# Patient Record
Sex: Female | Born: 1980
Health system: Southern US, Community
[De-identification: ages and names within clinical notes are randomized; demographics above are authoritative.]

## PROBLEM LIST (undated history)

## (undated) DIAGNOSIS — K219 Gastro-esophageal reflux disease without esophagitis: Secondary | ICD-10-CM

## (undated) DIAGNOSIS — I890 Lymphedema, not elsewhere classified: Secondary | ICD-10-CM

## (undated) DIAGNOSIS — D649 Anemia, unspecified: Secondary | ICD-10-CM

## (undated) DIAGNOSIS — O24419 Gestational diabetes mellitus in pregnancy, unspecified control: Secondary | ICD-10-CM

## (undated) HISTORY — DX: Lymphedema, not elsewhere classified: I89.0

## (undated) HISTORY — DX: Gastro-esophageal reflux disease without esophagitis: K21.9

## (undated) HISTORY — PX: NO PAST SURGERIES: SHX2092

## (undated) HISTORY — DX: Anemia, unspecified: D64.9

## (undated) HISTORY — DX: Gestational diabetes mellitus in pregnancy, unspecified control: O24.419

---

## 2015-06-02 ENCOUNTER — Other Ambulatory Visit: Payer: Self-pay | Admitting: Hematology and Oncology

## 2015-06-02 DIAGNOSIS — K047 Periapical abscess without sinus: Secondary | ICD-10-CM

## 2015-06-02 MED ORDER — AMOXICILLIN-POT CLAVULANATE 875-125 MG PO TABS: 1.0000 | ORAL_TABLET | Freq: Two times a day (BID) | ORAL | Status: DC

## 2016-06-01 MED FILL — CIPROFLOXACIN 0.3% EYE DROP: 0.3 | 7 days supply | Qty: 5 | Fill #0

## 2017-02-09 MED FILL — IBUPROFEN 600 MG TABLET: 600 | 3 days supply | Qty: 14 | Fill #0

## 2017-04-28 MED FILL — CHLORHEXIDINE 0.12% RINSE: 0.12 | 16 days supply | Qty: 473 | Fill #0

## 2017-04-28 MED FILL — AMOXICILLIN 500 MG CAPSULE: 500 | 7 days supply | Qty: 21 | Fill #0

## 2017-04-28 MED FILL — HYDROCODON-APAP 5-325: 5-325 | 2 days supply | Qty: 16 | Fill #0

## 2018-01-04 DIAGNOSIS — Z01419 Encounter for gynecological examination (general) (routine) without abnormal findings: Secondary | ICD-10-CM | POA: Diagnosis not present

## 2018-01-04 DIAGNOSIS — Z3040 Encounter for surveillance of contraceptives, unspecified: Secondary | ICD-10-CM | POA: Diagnosis not present

## 2018-01-04 DIAGNOSIS — Z124 Encounter for screening for malignant neoplasm of cervix: Secondary | ICD-10-CM | POA: Diagnosis not present

## 2018-01-04 DIAGNOSIS — Z6821 Body mass index (BMI) 21.0-21.9, adult: Secondary | ICD-10-CM | POA: Diagnosis not present

## 2018-01-04 LAB — HM PAP SMEAR

## 2018-01-09 LAB — HM PAP SMEAR

## 2018-03-16 ENCOUNTER — Encounter: Payer: Self-pay | Admitting: Family Medicine

## 2018-03-16 ENCOUNTER — Ambulatory Visit (INDEPENDENT_AMBULATORY_CARE_PROVIDER_SITE_OTHER): Payer: 59 | Admitting: Family Medicine

## 2018-03-16 VITALS — BP 110/74 | HR 55 | Temp 97.8°F | Ht 63.0 in | Wt 124.8 lb

## 2018-03-16 DIAGNOSIS — Z130 Encounter for screening for diseases of the blood and blood-forming organs and certain disorders involving the immune mechanism: Secondary | ICD-10-CM | POA: Diagnosis not present

## 2018-03-16 DIAGNOSIS — Z1231 Encounter for screening mammogram for malignant neoplasm of breast: Secondary | ICD-10-CM | POA: Diagnosis not present

## 2018-03-16 DIAGNOSIS — O24419 Gestational diabetes mellitus in pregnancy, unspecified control: Secondary | ICD-10-CM

## 2018-03-16 DIAGNOSIS — Z13220 Encounter for screening for lipoid disorders: Secondary | ICD-10-CM

## 2018-03-16 DIAGNOSIS — Z1239 Encounter for other screening for malignant neoplasm of breast: Secondary | ICD-10-CM

## 2018-03-16 DIAGNOSIS — Z803 Family history of malignant neoplasm of breast: Secondary | ICD-10-CM

## 2018-03-16 DIAGNOSIS — Z23 Encounter for immunization: Secondary | ICD-10-CM | POA: Diagnosis not present

## 2018-03-16 DIAGNOSIS — Z131 Encounter for screening for diabetes mellitus: Secondary | ICD-10-CM

## 2018-03-16 DIAGNOSIS — Z114 Encounter for screening for human immunodeficiency virus [HIV]: Secondary | ICD-10-CM

## 2018-03-16 DIAGNOSIS — Z0000 Encounter for general adult medical examination without abnormal findings: Secondary | ICD-10-CM | POA: Diagnosis not present

## 2018-03-16 LAB — COMPREHENSIVE METABOLIC PANEL
ALBUMIN: 3.9 g/dL (ref 3.5–5.2)
ALT: 12 U/L (ref 0–35)
AST: 16 U/L (ref 0–37)
Alkaline Phosphatase: 46 U/L (ref 39–117)
BUN: 7 mg/dL (ref 6–23)
CO2: 29 mEq/L (ref 19–32)
CREATININE: 0.54 mg/dL (ref 0.40–1.20)
Calcium: 8.8 mg/dL (ref 8.4–10.5)
Chloride: 102 mEq/L (ref 96–112)
GFR: 135.15 mL/min (ref >60.00)
Glucose, Bld: 74 mg/dL (ref 70–99)
Potassium: 4 mEq/L (ref 3.5–5.1)
SODIUM: 138 meq/L (ref 135–145)
Total Bilirubin: 0.5 mg/dL (ref 0.2–1.2)
Total Protein: 6.8 g/dL (ref 6.0–8.3)

## 2018-03-16 LAB — CBC WITH DIFFERENTIAL/PLATELET
BASOS ABS: 0 10*3/uL (ref 0.0–0.1)
Basophils Relative: 0.7 % (ref 0.0–3.0)
Eosinophils Absolute: 0.1 10*3/uL (ref 0.0–0.7)
Eosinophils Relative: 1.7 % (ref 0.0–5.0)
HCT: 34 % — ABNORMAL LOW (ref 36.0–46.0)
Hemoglobin: 11 g/dL — ABNORMAL LOW (ref 12.0–15.0)
LYMPHS ABS: 2.2 10*3/uL (ref 0.7–4.0)
Lymphocytes Relative: 34.5 % (ref 12.0–46.0)
MCHC: 32.5 g/dL (ref 30.0–36.0)
MCV: 73.3 fl — ABNORMAL LOW (ref 78.0–100.0)
MONO ABS: 0.6 10*3/uL (ref 0.1–1.0)
Monocytes Relative: 9.3 % (ref 3.0–12.0)
NEUTROS PCT: 53.8 % (ref 43.0–77.0)
Neutro Abs: 3.4 10*3/uL (ref 1.4–7.7)
Platelets: 335 10*3/uL (ref 150.0–400.0)
RBC: 4.64 Mil/uL (ref 3.87–5.11)
RDW: 15.1 % (ref 11.5–15.5)
WBC: 6.3 10*3/uL (ref 4.0–10.5)

## 2018-03-16 LAB — LIPID PANEL
CHOLESTEROL: 148 mg/dL (ref 0–200)
HDL: 45.2 mg/dL (ref >39.00)
LDL Cholesterol: 85 mg/dL (ref 0–99)
NonHDL: 102.59
Total CHOL/HDL Ratio: 3
Triglycerides: 88 mg/dL (ref 0.0–149.0)
VLDL: 17.6 mg/dL (ref 0.0–40.0)

## 2018-03-16 LAB — HEMOGLOBIN A1C: Hgb A1c MFr Bld: 5.3 % (ref 4.6–6.5)

## 2018-03-16 NOTE — Progress Notes (Signed)
Patient ID: Jennifer Mcdowell, female  DOB: 1981/05/09, 37 y.o.   MRN: [id number] Patient Care Team    Relationship Specialty Notifications Start End  Natalia Leatherwood, DO PCP - General Family Medicine  03/16/18   Silverio Lay, MD Consulting Physician Obstetrics and Gynecology  03/16/18     Chief Complaint  Patient presents with  . Establish Care    CPE    Subjective:  Jennifer Mcdowell is a 37 y.o.  female present for new patient establishment. All past medical history, surgical history, allergies, family history, immunizations, medications and social history were obtained and entered in the electronic medical record today. All recent labs, ED visits and hospitalizations within the last year were reviewed.  Health maintenance:  Colonoscopy: No Fhx, screen at 50 Mammogram: No prior screens, MGM and MGGM both w/ breast cancer at <60.--> order placed today for early screen, she will check with insurance on coverage. .  Cervical cancer screening: last pap: 01/04/2021, results: HPV +, completed by: Dr. Estanislado Pandy Immunizations: tdap updated today, Influenza declined (encouraged yearly) Infectious disease screening: HIV completed today, pt agreeable.  DEXA: screen at 60 Assistive device: none Oxygen WUJ:WJXB Patient has a Dental home. Hospitalizations/ED visits:reviewed  Depression screen [facility] 2/9 03/16/2018  Decreased Interest 0  Down, Depressed, Hopeless 0  PHQ - 2 Score 0   No flowsheet data found.  Current Exercise Habits: Home exercise routine;Structured exercise class, Type of exercise: treadmill;walking;Other - see comments, Time (Minutes): 30, Frequency (Times/Week): 7, Weekly Exercise (Minutes/Week): 210, Intensity: Moderate   No flowsheet data found.   Immunization History  Administered Date(s) Administered  . Tdap 03/16/2018    No exam data present  Past Medical History:  Diagnosis Date  . Gestational diabetes    Not on File History reviewed. No pertinent  surgical history. Family History  Problem Relation Age of Onset  . Hypertension Father   . Breast cancer Maternal Grandmother 28   Social History   Socioeconomic History  . Marital status: Married    Spouse name: Not on file  . Number of children: Not on file  . Years of education: Not on file  . Highest education level: Not on file  Occupational History  . Not on file  Social Needs  . Financial resource strain: Not on file  . Food insecurity:    Worry: Not on file    Inability: Not on file  . Transportation needs:    Medical: Not on file    Non-medical: Not on file  Tobacco Use  . Smoking status: Never Smoker  . Smokeless tobacco: Never Used  Substance and Sexual Activity  . Alcohol use: Yes    Alcohol/week: 0.6 oz    Types: 1 Glasses of wine per week    Comment: I GLASS OF WINE ONCE IN A WHILE  . Drug use: Yes  . Sexual activity: Yes    Partners: Male    Birth control/protection: Condom  Lifestyle  . Physical activity:    Days per week: Not on file    Minutes per session: Not on file  . Stress: Not on file  Relationships  . Social connections:    Talks on phone: Not on file    Gets together: Not on file    Attends religious service: Not on file    Active member of club or organization: Not on file    Attends meetings of clubs or organizations: Not on file    Relationship status:  Not on file  . Intimate partner violence:    Fear of current or ex partner: Not on file    Emotionally abused: Not on file    Physically abused: Not on file    Forced sexual activity: Not on file  Other Topics Concern  . Not on file  Social History Narrative  . Not on file   Allergies as of 03/16/2018   Not on File     Medication List    as of 03/16/2018 12:15 PM   You have not been prescribed any medications.     All past medical history, surgical history, allergies, family history, immunizations andmedications were updated in the EMR today and reviewed under the history  and medication portions of their EMR.    Recent Results (from the past 2160 hour(s))  HM PAP SMEAR     Status: None   Collection Time: 01/04/18 12:00 AM  Result Value Ref Range   HM Pap smear see report     Patient was never admitted.   ROS: 14 pt review of systems performed and negative (unless mentioned in an HPI)  Objective: BP 110/74 (BP Location: Right Arm, Patient Position: Sitting, Cuff Size: Normal)   Pulse (!) 55   Temp 97.8 F (36.6 C) (Oral)   Ht 5\' 3"  (1.6 m)   Wt 124 lb 12.8 oz (56.6 kg)   LMP 03/12/2018   SpO2 100%   BMI 22.11 kg/m  Gen: Afebrile. No acute distress. Nontoxic in appearance, well-developed, well-nourished,  Very pleasant female.  HENT: AT. [place]. Bilateral TM visualized and normal in appearance, normal external auditory canal. MMM, no oral lesions, adequate dentition. Bilateral nares within normal limits. Throat without erythema, ulcerations or exudates. no Cough on exam, no hoarseness on exam. Eyes:Pupils Equal Round Reactive to light, Extraocular movements intact,  Conjunctiva without redness, discharge or icterus. Neck/lymp/endocrine: Supple,no lymphadenopathy, no thyromegaly CV: RRR no murmur, no edema, +2/4 P posterior tibialis pulses. no carotid bruits. No JVD. Chest: CTAB, no wheeze, rhonchi or crackles. normal Respiratory effort. good Air movement. Abd: Soft. flat. NTND. BS present. no Masses palpated. No hepatosplenomegaly. No rebound tenderness or guarding. Skin: no rashes, purpura or petechiae. Warm and well-perfused. Skin intact. Neuro/Msk:  Normal gait. PERLA. EOMi. Alert. Oriented x3.  Cranial nerves II through XII intact. Muscle strength 5/5 upper/lower extremity. DTRs equal bilaterally. Psych: Normal affect, dress and demeanor. Normal speech. Normal thought content and judgment.   Assessment/plan: Jennifer Mcdowell is a 37 y.o. female present for EST/CPE.  Encounter for preventive health examination - Comprehensive metabolic  panel Patient was encouraged to exercise greater than 150 minutes a week. Patient was encouraged to choose a diet filled with fresh fruits and vegetables, and lean meats. AVS provided to patient today for education/recommendation on gender specific health and safety maintenance. Colonoscopy: No Fhx, screen at 50 Mammogram: No prior screens, MGM and MGGM both w/ breast cancer at <60.--> order placed today for early screen, she will check with insurance on coverage. .  Cervical cancer screening: last pap: 01/04/2021, results: HPV +, completed by: Dr. Estanislado Pandy Immunizations: tdap updated today, Influenza declined (encouraged yearly) Infectious disease screening: HIV completed today, pt agreeable.  DEXA: screen at 60 Diabetes mellitus screening - Hemoglobin A1c Screening for iron deficiency anemia - CBC with Differential/Platelet Screening cholesterol level - Lipid panel H/O Gestational diabetes mellitus (GDM) - Hemoglobin A1c Encounter for screening for HIV - HIV antibody (with reflex) Immunization due - Tdap vaccine greater than or equal  to 7yo IM Breast cancer screening, high risk patient/FHX breast cancer - MM DIGITAL SCREENING BILATERAL; Future    Return in about 1 year (around 03/17/2019) for CPE.   Note is dictated utilizing voice recognition software. Although note has been proof read prior to signing, occasional typographical errors still can be missed. If any questions arise, please do not hesitate to call for verification.  Electronically signed by: Felix Pacinienee Kuneff, DO [facility]

## 2018-03-16 NOTE — Patient Instructions (Signed)

## 2018-03-17 LAB — HIV ANTIBODY (ROUTINE TESTING W REFLEX): HIV 1&2 Ab, 4th Generation: NONREACTIVE

## 2018-03-20 ENCOUNTER — Telehealth: Payer: Self-pay | Admitting: Family Medicine

## 2018-03-20 NOTE — Telephone Encounter (Signed)
Spoke with patient reviewed lab results and instructions. Patient verbalized understanding. Scheduled an appt for patient.

## 2018-03-20 NOTE — Telephone Encounter (Signed)
Please inform patient the following information: Her labs are all normal with the exception of very mild abnormal hgb at 11. Her other levels are in a pattern most consistent with iron deficiency as potential cause. Again, this is just mildly lower than "normal" and I do not have any prior records to compare for her, to know her baseline.  Most common causes if iron deficiency is lack of iron in the diet or blood loss (heavy menses or loss through colon) which she did not report.  I would like to follow up with her and discuss, as well as obtain iron panel. Please have her schedule an appt at her earliest convenience.

## 2018-04-04 ENCOUNTER — Ambulatory Visit (INDEPENDENT_AMBULATORY_CARE_PROVIDER_SITE_OTHER): Payer: 59 | Admitting: Family Medicine

## 2018-04-04 ENCOUNTER — Encounter: Payer: Self-pay | Admitting: Family Medicine

## 2018-04-04 VITALS — BP 118/80 | HR 72 | Temp 98.4°F | Resp 20 | Ht 63.0 in | Wt 122.0 lb

## 2018-04-04 DIAGNOSIS — D649 Anemia, unspecified: Secondary | ICD-10-CM | POA: Diagnosis not present

## 2018-04-04 DIAGNOSIS — R718 Other abnormality of red blood cells: Secondary | ICD-10-CM | POA: Diagnosis not present

## 2018-04-04 LAB — CBC WITH DIFFERENTIAL/PLATELET
BASOS ABS: 0.1 10*3/uL (ref 0.0–0.1)
Basophils Relative: 1.1 % (ref 0.0–3.0)
Eosinophils Absolute: 0.2 10*3/uL (ref 0.0–0.7)
Eosinophils Relative: 2.8 % (ref 0.0–5.0)
HCT: 37.4 % (ref 36.0–46.0)
Hemoglobin: 12.2 g/dL (ref 12.0–15.0)
Lymphocytes Relative: 36.5 % (ref 12.0–46.0)
Lymphs Abs: 2.5 10*3/uL (ref 0.7–4.0)
MCHC: 32.7 g/dL (ref 30.0–36.0)
MCV: 73 fl — AB (ref 78.0–100.0)
MONO ABS: 0.8 10*3/uL (ref 0.1–1.0)
Monocytes Relative: 11.2 % (ref 3.0–12.0)
NEUTROS ABS: 3.4 10*3/uL (ref 1.4–7.7)
NEUTROS PCT: 48.4 % (ref 43.0–77.0)
PLATELETS: 272 10*3/uL (ref 150.0–400.0)
RBC: 5.12 Mil/uL — AB (ref 3.87–5.11)
RDW: 14.9 % (ref 11.5–15.5)
WBC: 7 10*3/uL (ref 4.0–10.5)

## 2018-04-04 NOTE — Patient Instructions (Signed)
I will call you with labs results once available.    Iron Deficiency Anemia, Adult Iron-deficiency anemia is when you have a low amount of red blood cells or hemoglobin. This happens because you have too little iron in your body. Hemoglobin carries oxygen to parts of the body. Anemia can cause your body to not get enough oxygen. It may or may not cause symptoms. Follow these instructions at home: Medicines  Take over-the-counter and prescription medicines only as told by your doctor. This includes iron pills (supplements) and vitamins.  If you cannot handle taking iron pills by mouth, ask your doctor about getting iron through: ? A vein (intravenously). ? A shot (injection) into a muscle.  Take iron pills when your stomach is empty. If you cannot handle this, take them with food.  Do not drink milk or take antacids at the same time as your iron pills.  To prevent trouble pooping (constipation), eat fiber or take medicine (stool softener) as told by your doctor. Eating and drinking  Talk with your doctor before changing the foods you eat. He or she may tell you to eat foods that have a lot of iron, such as: ? Liver. ? Lowfat (lean) beef. ? Breads and cereals that have iron added to them (fortified breads and cereals). ? Eggs. ? Dried fruit. ? Dark green, leafy vegetables.  Drink enough fluid to keep your pee (urine) clear or pale yellow.  Eat fresh fruits and vegetables that are high in vitamin C. They help your body to use iron. Foods with a lot of vitamin C include: ? Oranges. ? Peppers. ? Tomatoes. ? Mangoes. General instructions  Return to your normal activities as told by your doctor. Ask your doctor what activities are safe for you.  Keep yourself clean, and keep things clean around you (your surroundings). Anemia can make you get sick more easily.  Keep all follow-up visits as told by your doctor. This is important. Contact a doctor if:  You feel sick to your  stomach (nauseous).  You throw up (vomit).  You feel weak.  You are sweating for no clear reason.  You have trouble pooping, such as: ? Pooping (having a bowel movement) less than 3 times a week. ? Straining to poop. ? Having poop that is hard, dry, or larger than normal. ? Feeling full or bloated. ? Pain in the lower belly. ? Not feeling better after pooping. Get help right away if:  You pass out (faint). If this happens, do not drive yourself to the hospital. Call your local emergency services (911 in the [place]  You have chest pain.  You have shortness of breath that: ? Is very bad. ? Gets worse with physical activity.  You have a fast heartbeat.  You get light-headed when getting up from sitting or lying down. This information is not intended to replace advice given to you by your health care provider. Make sure you discuss any questions you have with your health care provider. Document Released: 12/24/2010 Document Revised: 08/10/2016 Document Reviewed: 08/10/2016 Elsevier Interactive Patient Education  2017 [employer].

## 2018-04-04 NOTE — Progress Notes (Signed)
Jennifer Mcdowell , [birth year], 37 y.o., female MRN: [id number] Patient Care Team    Relationship Specialty Notifications Start End  Jennifer Leatherwood, DO PCP - General Family Medicine  03/16/18   Jennifer Lay, MD Consulting Physician Obstetrics and Gynecology  03/16/18     Chief Complaint  Patient presents with  . abnormal lab results     Subjective:  Presents today to discuss her abnormal labs.  Briefly patient was found to have mildly decreased hemoglobin, hematocrit and MCV.  Patient does not eat red meat.  She denies any heavy menses.  Reports her menstrual cycle occurs once monthly, last about 1 week, and is only heavy 1 day.  She states she was on her menstrual cycle when labs were collected last visit.  She denies any rectal bleeding.  She denies any chest pain, shortness of breath or fatigue.  She admits to mild weakness in her lower extremities on occasions.  Please inform patient the following information: Her labs are all normal with the exception of very mild abnormal hgb at 11. Her other levels are in a pattern most consistent with iron deficiency as potential cause. Again, this is just mildly lower than "normal" and I do not have any prior records to compare for her, to know her baseline.  Most common causes if iron deficiency is lack of iron in the diet or blood loss (heavy menses or loss through colon) which she did not report.  I would like to follow up with her and discuss, as well as obtain iron panel. Please have her schedule an appt at her earliest convenience   Depression screen [facility] 2/9 03/16/2018  Decreased Interest 0  Down, Depressed, Hopeless 0  PHQ - 2 Score 0    No Known Allergies Social History   Tobacco Use  . Smoking status: Never Smoker  . Smokeless tobacco: Never Used  Substance Use Topics  . Alcohol use: Yes    Alcohol/week: 0.6 oz    Types: 1 Glasses of wine per week    Comment: I GLASS OF WINE ONCE IN A WHILE   Past Medical History:    Diagnosis Date  . Gestational diabetes    History reviewed. No pertinent surgical history. Family History  Problem Relation Age of Onset  . Hypertension Father   . Breast cancer Maternal Grandmother 60   Allergies as of 04/04/2018   No Known Allergies     Medication List    as of 04/04/2018  8:45 AM   You have not been prescribed any medications.     All past medical history, surgical history, allergies, family history, immunizations andmedications were updated in the EMR today and reviewed under the history and medication portions of their EMR.     ROS: Negative, with the exception of above mentioned in HPI   Objective:  BP 118/80 (BP Location: Left Arm, Patient Position: Sitting, Cuff Size: Normal)   Pulse 72   Temp 98.4 F (36.9 C)   Resp 20   Ht  (1.6 m)   Wt 122 lb (55.3 kg)   LMP 03/12/2018   SpO2 100%   BMI 21.61 kg/m  Body mass index is 21.61 kg/m. Gen: Afebrile. No acute distress. Nontoxic in appearance, well developed, well nourished.  HENT: AT. [place].  MMM Eyes:Pupils Equal Round Reactive to light, Extraocular movements intact,  Conjunctiva without redness, discharge or icterus. Neck/lymp/endocrine: Supple, no lymphadenopathy CV: RRR no murmur, no edema Chest: CTAB, no wheeze or  crackles. Good air movement, normal resp effort.  Abd: Soft.NTND. BS+ Neuro:  Normal gait. PERLA. EOMi. Alert. Oriented x3   No exam data present No results found. No results found for this or any previous visit (from the past 24 hour(s)).  Assessment/Plan: Jennifer Mcdowell is a 36 y.o. female present for OV for  Anemia, unspecified type Low mean corpuscular volume (MCV) Suspect dietary cause likely iron insufficiency.  Repeat CBC today with added iron panel. -If iron deficient patient will be encouraged to consume higher iron rather through dietary means or supplements.  If supplements are used to she should use stool softener to avoid constipation. - CBC w/Diff -  Iron, TIBC and Ferritin Panel -Follow-up PRN  Reviewed expectations re: course of current medical issues.  Discussed self-management of symptoms.  Outlined signs and symptoms indicating need for more acute intervention.  Patient verbalized understanding and all questions were answered.  Patient received an After-Visit Summary.    No orders of the defined types were placed in this encounter.    Note is dictated utilizing voice recognition software. Although note has been proof read prior to signing, occasional typographical errors still can be missed. If any questions arise, please do not hesitate to call for verification.   electronically signed by:  Jennifer Pacini, DO  [facility] [place]

## 2018-04-05 LAB — IRON,TIBC AND FERRITIN PANEL
%SAT: 8 % — AB (ref 11–50)
Ferritin: 6 ng/mL — ABNORMAL LOW (ref 10–154)
Iron: 34 ug/dL — ABNORMAL LOW (ref 40–190)
TIBC: 434 ug/dL (ref 250–450)

## 2018-05-29 ENCOUNTER — Telehealth: Payer: Self-pay

## 2018-05-29 NOTE — Telephone Encounter (Signed)
Patient notified she needs to schedule an appointment. Appointment scheduled.

## 2018-05-29 NOTE — Telephone Encounter (Signed)
Copied from CRM [phone]. Topic: Inquiry >> May 29, 2018  9:22 AM Windy KalataMichael, Taylor L, NT wrote: Reason for CRM: patient is calling and states she has noticed a increase in acne for 3 months. She states it is very painful. Requesting to speak with the doctor about this.

## 2018-05-30 ENCOUNTER — Ambulatory Visit
Admission: RE | Admit: 2018-05-30 | Discharge: 2018-05-30 | Disposition: A | Discharge status: Home / Self Care | Payer: 59 | Source: Ambulatory Visit | Attending: Family Medicine | Admitting: Family Medicine

## 2018-05-30 ENCOUNTER — Encounter: Payer: Self-pay | Admitting: Family Medicine

## 2018-05-30 DIAGNOSIS — Z1231 Encounter for screening mammogram for malignant neoplasm of breast: Secondary | ICD-10-CM | POA: Diagnosis not present

## 2018-05-30 DIAGNOSIS — Z1239 Encounter for other screening for malignant neoplasm of breast: Secondary | ICD-10-CM

## 2018-05-30 DIAGNOSIS — R928 Other abnormal and inconclusive findings on diagnostic imaging of breast: Secondary | ICD-10-CM | POA: Insufficient documentation

## 2018-05-30 DIAGNOSIS — Z803 Family history of malignant neoplasm of breast: Secondary | ICD-10-CM

## 2018-05-31 ENCOUNTER — Encounter: Payer: Self-pay | Admitting: Family Medicine

## 2018-05-31 ENCOUNTER — Other Ambulatory Visit: Payer: Self-pay | Admitting: Family Medicine

## 2018-05-31 ENCOUNTER — Ambulatory Visit (INDEPENDENT_AMBULATORY_CARE_PROVIDER_SITE_OTHER): Payer: 59 | Admitting: Family Medicine

## 2018-05-31 ENCOUNTER — Telehealth: Payer: Self-pay | Admitting: *Deleted

## 2018-05-31 VITALS — BP 103/72 | HR 63 | Temp 98.3°F | Resp 20 | Ht 63.0 in | Wt 124.0 lb

## 2018-05-31 DIAGNOSIS — L700 Acne vulgaris: Secondary | ICD-10-CM | POA: Diagnosis not present

## 2018-05-31 DIAGNOSIS — R928 Other abnormal and inconclusive findings on diagnostic imaging of breast: Secondary | ICD-10-CM

## 2018-05-31 MED ORDER — CLINDAMYCIN PHOS-BENZOYL PEROX 1-5 % EX GEL: Freq: Every day | CUTANEOUS | 5 refills | Status: DC

## 2018-05-31 MED ORDER — DOXYCYCLINE HYCLATE 100 MG PO TABS: 100.0000 mg | ORAL_TABLET | Freq: Every day | ORAL | 0 refills | Status: DC

## 2018-05-31 MED FILL — CLINDAMYCIN PHOS-BENZOYL PE: 1-5 | 20 days supply | Qty: 50 | Fill #0

## 2018-05-31 MED FILL — DOXYCYCLINE HYCLATE 100 MG: 100 | 90 days supply | Qty: 90 | Fill #0

## 2018-05-31 NOTE — Patient Instructions (Signed)
Cleanse twice a day with dove scent free soap.  Start gel at night.  Start doxycyline once daily with food.  Follow up 3 months, sooner if needed .    Acne Acne is a skin problem that causes small, red bumps (pimples). Acne happens when the tiny holes in your skin (pores) get blocked. Your pores may become red, sore, and swollen. They may also become infected. Acne is a common skin problem. It is especially common in teenagers. Acne usually goes away over time. Follow these instructions at home: Good skin care is the most important thing you can do to treat your acne. Take care of your skin as told by your doctor. You may be told to do these things:  Wash your skin gently at least two times each day. You should also wash your skin: ? After you exercise. ? Before you go to bed.  Use mild soap.  Use a water-based skin moisturizer after you wash your skin.  Use a sunscreen or sunblock with SPF 30 or greater. This is very important if you are using acne medicines.  Choose cosmetics that will not plug your oil glands (are noncomedogenic).  Medicines  Take over-the-counter and prescription medicines only as told by your doctor.  If you were prescribed an antibiotic medicine, apply or take it as told by your doctor. Do not stop using the antibiotic even if your acne improves. General instructions  Keep your hair clean and off of your face. Shampoo your hair regularly. If you have oily hair, you may need to wash it every day.  Avoid leaning your chin or forehead on your hands.  Avoid wearing tight headbands or hats.  Avoid picking or squeezing your pimples. That can make your acne worse and cause scarring.  Keep all follow-up visits as told by your doctor. This is important.  Shave gently. Only shave when it is necessary.  Keep a food journal. This can help you to see if any foods are linked with your acne. Contact a doctor if:  Your acne is not better after eight weeks.  Your  acne gets worse.  You have a large area of skin that is red or tender.  You think that you are having side effects from any acne medicine. This information is not intended to replace advice given to you by your health care provider. Make sure you discuss any questions you have with your health care provider. Document Released: 11/10/2011 Document Revised: 04/28/2016 Document Reviewed: 01/28/2015 Elsevier Interactive Patient Education  [employer]2018 Else[employer] Inc[employer]

## 2018-05-31 NOTE — Telephone Encounter (Signed)
[facility] received requesting additional testing. Placed information on Dr Alan RipperKuneff's desk.

## 2018-05-31 NOTE — Telephone Encounter (Signed)
I already signed in the computer today. Use stamp if they are now sending another form to sign.

## 2018-05-31 NOTE — Progress Notes (Signed)
Jennifer Mcdowell , [birth year], 37 y.o., female MRN: [id number] Patient Care Team    Relationship Specialty Notifications Start End  Natalia LeatherwoodKuneff, Renee A, DO PCP - General Family Medicine  03/16/18   Silverio Layivard, Sandra, MD Consulting Physician Obstetrics and Gynecology  03/16/18     Chief Complaint  Patient presents with  . Acne     Subjective: Pt presents for an OV with complaints of increase in acne of 4 months  duration.  Associated symptoms include painful cystic lesions  jawline and chin. She reports she had rather bad acne as a child. Her menstrual cycles are heavier in nature. She has had anemia. She has been trying to wash her face twice a day and using "clean and clear" product.   Depression screen PH[facility] 2/9 03/16/2018  Decreased Interest 0  Down, Depressed, Hopeless 0  PHQ - 2 Score 0    No Known Allergies Social History   Tobacco Use  . Smoking status: Never Smoker  . Smokeless tobacco: Never Used  Substance Use Topics  . Alcohol use: Yes    Alcohol/week: 0.6 oz    Types: 1 Glasses of wine per week    Comment: I GLASS OF WINE ONCE IN A WHILE   Past Medical History:  Diagnosis Date  . Gestational diabetes    History reviewed. No pertinent surgical history. Family History  Problem Relation Age of Onset  . Hypertension Father   . Breast cancer Maternal Grandmother 55       passed away at 55 in stage 4 at diagnosis.    Allergies as of 05/31/2018   No Known Allergies     Medication List    as of 05/31/2018 10:15 AM   You have not been prescribed any medications.     All past medical history, surgical history, allergies, family history, immunizations andmedications were updated in the EMR today and reviewed under the history and medication portions of their EMR.     ROS: Negative, with the exception of above mentioned in HPI   Objective:  BP 103/72 (BP Location: Left Arm, Patient Position: Sitting, Cuff Size: Normal)   Pulse 63   Temp 98.3 F (36.8 C)    Resp 20   Ht 5\' 3"  (1.6 m)   Wt 124 lb (56.2 kg)   LMP 05/15/2018   SpO2 99%   BMI 21.97 kg/m  Body mass index is 21.97 kg/m. Gen: Afebrile. No acute distress. Nontoxic in appearance, well developed, well nourished.  HENT: AT. [place].  MMM Eyes:Pupils Equal Round Reactive to light, Extraocular movements intact,  Conjunctiva without redness, discharge or icterus. Skin: x5 cystic tender acne lesions left jawline, x2 cystic lesions chin. > 20 closed comedone lesions chin and cheeks.  Neuro:  Normal gait. PERLA. EOMi. Alert. Oriented x3   No results found for this or any previous visit (from the past 24 hour(s)).  Assessment/Plan: Jennifer Mcdowell is a 37 y.o. female present for OV for  Cystic acne vulgaris Discussed multiple options with her today.  - OCP is an option, If not responsive to daily acne tx.  - She decided on benzaclin gel nightly and daily doxy 100 mg. - consider adapalene in place of benzaclin if not effective. - Wash face BID with dove, unscented soap.  - follow up in 3 months sooner if worsening.     Reviewed expectations re: course of current medical issues.  Discussed self-management of symptoms.  Outlined signs and symptoms indicating need for more  acute intervention.  Patient verbalized understanding and all questions were answered.  Patient received an After-Visit Summary.    No orders of the defined types were placed in this encounter.    Note is dictated utilizing voice recognition software. Although note has been proof read prior to signing, occasional typographical errors still can be missed. If any questions arise, please do not hesitate to call for verification.   electronically signed by:  Howard Pouch, DO  [place]

## 2018-05-31 NOTE — Telephone Encounter (Signed)
Form [facility] to [facility]

## 2018-06-05 ENCOUNTER — Ambulatory Visit
Admission: RE | Admit: 2018-06-05 | Discharge: 2018-06-05 | Disposition: A | Discharge status: Home / Self Care | Payer: 59 | Source: Ambulatory Visit | Attending: Family Medicine | Admitting: Family Medicine

## 2018-06-05 ENCOUNTER — Other Ambulatory Visit: Payer: Self-pay | Admitting: Family Medicine

## 2018-06-05 DIAGNOSIS — N6310 Unspecified lump in the right breast, unspecified quadrant: Secondary | ICD-10-CM

## 2018-06-05 DIAGNOSIS — R928 Other abnormal and inconclusive findings on diagnostic imaging of breast: Secondary | ICD-10-CM

## 2018-06-05 DIAGNOSIS — N6312 Unspecified lump in the right breast, upper inner quadrant: Secondary | ICD-10-CM | POA: Diagnosis not present

## 2018-06-05 DIAGNOSIS — N6311 Unspecified lump in the right breast, upper outer quadrant: Secondary | ICD-10-CM | POA: Diagnosis not present

## 2018-06-05 DIAGNOSIS — R922 Inconclusive mammogram: Secondary | ICD-10-CM | POA: Diagnosis not present

## 2018-06-11 ENCOUNTER — Encounter: Payer: Self-pay | Admitting: Family Medicine

## 2018-06-11 DIAGNOSIS — N6310 Unspecified lump in the right breast, unspecified quadrant: Secondary | ICD-10-CM | POA: Insufficient documentation

## 2018-09-10 ENCOUNTER — Ambulatory Visit (INDEPENDENT_AMBULATORY_CARE_PROVIDER_SITE_OTHER): Payer: 59

## 2018-09-10 DIAGNOSIS — Z23 Encounter for immunization: Secondary | ICD-10-CM | POA: Diagnosis not present

## 2018-09-12 ENCOUNTER — Encounter: Payer: Self-pay | Admitting: Family Medicine

## 2018-09-12 ENCOUNTER — Ambulatory Visit: Payer: 59 | Admitting: Family Medicine

## 2018-09-12 VITALS — BP 129/88 | HR 80 | Temp 98.6°F | Resp 20 | Ht 63.0 in | Wt 123.0 lb

## 2018-09-12 DIAGNOSIS — D509 Iron deficiency anemia, unspecified: Secondary | ICD-10-CM | POA: Diagnosis not present

## 2018-09-12 DIAGNOSIS — L700 Acne vulgaris: Secondary | ICD-10-CM

## 2018-09-12 LAB — CBC
HEMATOCRIT: 38.9 % (ref 36.0–46.0)
Hemoglobin: 12.7 g/dL (ref 12.0–15.0)
MCHC: 32.7 g/dL (ref 30.0–36.0)
MCV: 81.5 fl (ref 78.0–100.0)
PLATELETS: 316 10*3/uL (ref 150.0–400.0)
RBC: 4.77 Mil/uL (ref 3.87–5.11)
RDW: 13.6 % (ref 11.5–15.5)
WBC: 6.6 10*3/uL (ref 4.0–10.5)

## 2018-09-12 MED ORDER — DOXYCYCLINE HYCLATE 100 MG PO TABS: 100.0000 mg | ORAL_TABLET | Freq: Every day | ORAL | 3 refills | Status: DC

## 2018-09-12 MED FILL — DOXYCYCLINE HYCLATE 100 MG: 100 | 90 days supply | Qty: 90 | Fill #0

## 2018-09-12 NOTE — Patient Instructions (Addendum)
Refilled doxycycline for you today. Continue to wash daily and apply the gel at night (many refills on gel). As long as doing well, will see you yearly for this (can be your physical).  Continue iron, we will call with results and adjust dose or consider GI or GYN referral.  Continue to try to add iron rich foods to diet, along with multivitamin. Adding Vit C can help with iron absorption.   Iron-Rich Diet Iron is a mineral that helps your body to produce hemoglobin. Hemoglobin is a protein in your red blood cells that carries oxygen to your body's tissues. Eating too little iron may cause you to feel weak and tired, and it can increase your risk for infection. Eating enough iron is necessary for your body's metabolism, muscle function, and nervous system. Iron is naturally found in many foods. It can also be added to foods or fortified in foods. There are two types of dietary iron:  Heme iron. Heme iron is absorbed by the body more easily than nonheme iron. Heme iron is found in meat, poultry, and fish.  Nonheme iron. Nonheme iron is found in dietary supplements, iron-fortified grains, beans, and vegetables.  You may need to follow an iron-rich diet if:  You have been diagnosed with iron deficiency or iron-deficiency anemia.  You have a condition that prevents you from absorbing dietary iron, such as: ? Infection in your intestines. ? Celiac disease. This involves long-lasting (chronic) inflammation of your intestines.  You do not eat enough iron.  You eat a diet that is high in foods that impair iron absorption.  You have lost a lot of blood.  You have heavy bleeding during your menstrual cycle.  You are pregnant.  What is my plan? Your health care provider may help you to determine how much iron you need per day based on your condition. Generally, when a person consumes sufficient amounts of iron in the diet, the following iron needs are met:  Men. ? 68-31 years old: 11 mg  per day. ? 25-45 years old: 8 mg per day.  Women. ? 39-27 years old: 15 mg per day. ? 48-15 years old: 18 mg per day. ? Over 24 years old: 8 mg per day. ? Pregnant women: 27 mg per day. ? Breastfeeding women: 9 mg per day.  What do I need to know about an iron-rich diet?  Eat fresh fruits and vegetables that are high in vitamin C along with foods that are high in iron. This will help increase the amount of iron that your body absorbs from food, especially with foods containing nonheme iron. Foods that are high in vitamin C include oranges, peppers, tomatoes, and mango.  Take iron supplements only as directed by your health care provider. Overdose of iron can be life-threatening. If you were prescribed iron supplements, take them with orange juice or a vitamin C supplement.  Cook foods in pots and pans that are made from iron.  Eat nonheme iron-containing foods alongside foods that are high in heme iron. This helps to improve your iron absorption.  Certain foods and drinks contain compounds that impair iron absorption. Avoid eating these foods in the same meal as iron-rich foods or with iron supplements. These include: ? Coffee, black tea, and red wine. ? Milk, dairy products, and foods that are high in calcium. ? Beans, soybeans, and peas. ? Whole grains.  When eating foods that contain both nonheme iron and compounds that impair iron absorption, follow these tips  to absorb iron better. ? Soak beans overnight before cooking. ? Soak whole grains overnight and drain them before using. ? Ferment flours before baking, such as using yeast in bread dough. What foods can I eat? Grains Iron-fortified breakfast cereal. Iron-fortified whole-wheat bread. Enriched rice. Sprouted grains. Vegetables Spinach. Potatoes with skin. Green peas. Broccoli. Red and green bell peppers. Fermented vegetables. Fruits Prunes. Raisins. Oranges. Strawberries. Mango. Grapefruit. Meats and Other Protein  Sources Beef liver. Oysters. Beef. Shrimp. [place]. Chicken. [place]. Sardines. Chickpeas. Nuts. Tofu. Beverages Tomato juice. Fresh orange juice. Prune juice. Hibiscus tea. Fortified instant breakfast shakes. Condiments Tahini. Fermented soy sauce. Sweets and Desserts Black-strap molasses. Other Wheat germ. The items listed above may not be a complete list of recommended foods or beverages. Contact your dietitian for more options. What foods are not recommended? Grains Whole grains. Bran cereal. Bran flour. Oats. Vegetables Artichokes. Brussels sprouts. Kale. Fruits Blueberries. Raspberries. Strawberries. Figs. Meats and Other Protein Sources Soybeans. Products made from soy protein. Dairy Milk. Cream. Cheese. Yogurt. Cottage cheese. Beverages Coffee. Black tea. Red wine. Sweets and Desserts Cocoa. Chocolate. Ice cream. Other Basil. Oregano. Parsley. The items listed above may not be a complete list of foods and beverages to avoid. Contact your dietitian for more information. This information is not intended to replace advice given to you by your health care provider. Make sure you discuss any questions you have with your health care provider. Document Released: 07/05/2005 Document Revised: 06/10/2016 Document Reviewed: 06/18/2014 Elsevier Interactive Patient Education  [employer].

## 2018-09-12 NOTE — Progress Notes (Signed)
Jennifer Mcdowell , Aug 03, 1981, 37 y.o., female MRN: [id number] Patient Care Team    Relationship Specialty Notifications Start End  Natalia Leatherwood, DO PCP - General Family Medicine  03/16/18   Silverio Lay, MD Consulting Physician Obstetrics and Gynecology  03/16/18     Chief Complaint  Patient presents with  . Follow-up    anemia     Subjective: Jennifer Mcdowell is a 37 y.o. female present for Iron deficiency anemia, unspecified iron deficiency anemia type Pt reports she is taking 1 tab of iron daily. She is unable to tolerate more frequent dosing 2/2 to GI side effects. She is taking with food and using colace. She denies chest pain, SOB or fatigue.  Prior note:  Presents today to discuss her abnormal labs.  Briefly patient was found to have mildly decreased hemoglobin, hematocrit and MCV.  Patient does not eat red meat.  She denies any heavy menses.  Reports her menstrual cycle occurs once monthly, last about 1 week, and is only heavy 1 day.  She states she was on her menstrual cycle when labs were collected last visit.  She denies any rectal bleeding.  She denies any chest pain, shortness of breath or fatigue.  She admits to mild weakness in her lower extremities on occasions.  Cystic Acne: She reports her acne is much improved on benzacilin gel and Doxy 100 mg QD. She has occasional pimple formation, but overall more comfortable, less painful and no more cystic area.  Prior note: Pt presents for an OV with complaints of increase in acne of 4 months  duration.  Associated symptoms include painful cystic lesions  jawline and chin. She reports she had rather bad acne as a child. Her menstrual cycles are heavier in nature. She has had anemia. She has been trying to wash her face twice a day and using "clean and clear" product.   Depression screen PHQ 2/9 03/16/2018  Decreased Interest 0  Down, Depressed, Hopeless 0  PHQ - 2 Score 0    No Known Allergies Social History    Tobacco Use  . Smoking status: Never Smoker  . Smokeless tobacco: Never Used  Substance Use Topics  . Alcohol use: Yes    Alcohol/week: 1.0 standard drinks    Types: 1 Glasses of wine per week    Comment: I GLASS OF WINE ONCE IN A WHILE   Past Medical History:  Diagnosis Date  . Gestational diabetes    History reviewed. No pertinent surgical history. Family History  Problem Relation Age of Onset  . Hypertension Father   . Breast cancer Maternal Grandmother 55       passed away at 55 in stage 4 at diagnosis.    Allergies as of 09/12/2018   No Known Allergies     Medication List        Accurate as of 09/12/18  8:33 AM. Always use your most recent med list.          clindamycin-benzoyl peroxide gel Commonly known as:  BENZACLIN Apply topically at bedtime.   doxycycline 100 MG tablet Commonly known as:  VIBRA-TABS Take 1 tablet (100 mg total) by mouth daily.       All past medical history, surgical history, allergies, family history, immunizations andmedications were updated in the EMR today and reviewed under the history and medication portions of their EMR.     ROS: Negative, with the exception of above mentioned in HPI   Objective:  BP  129/88 (BP Location: Left Arm, Patient Position: Sitting, Cuff Size: Normal)   Pulse 80   Temp 98.6 F (37 C)   Resp 20   Ht 5\' 3"  (1.6 m)   Wt 123 lb (55.8 kg)   SpO2 99%   BMI 21.79 kg/m  Body mass index is 21.79 kg/m. Gen: Afebrile. No acute distress. Nontoxic.  Eyes:Pupils Equal Round Reactive to light, Extraocular movements intact,  Conjunctiva without redness, discharge or icterus. CV: RRR no murmur, no edema, +2/4 P posterior tibialis pulses Chest: CTAB, no wheeze or crackles Skin: no rashes, purpura or petechiae. X3 closed comedones chin area and x1 large white head nasal bridge.    No results found for this or any previous visit (from the past 24 hour(s)).  Assessment/Plan: Jennifer Mcdowell is a 37 y.o.  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 Pt is please with outcome.  - OCP is an option, If not responsive to daily acne tx in the future - Continue  benzaclin gel nightly and daily doxy 100 mg.- refilled today. She is try 1/2 tab doxy if able once well controlled and can increase to 100 mg during flares.  - Wash face BID with dove, unscented soap.  - follow up yearly  Anemia, unspecified type Low mean corpuscular volume (MCV) Suspect dietary cause likely iron insufficiency.  Only taking iron Qd secondary to GI tolerance. Continue dose for now with stool softener. Continue to incorporate iron rich foods in diet. Add vit C supplement.  - If iron is not adequate may need to consider other options.  - CBC w/Diff - Iron, TIBC and Ferritin Panel -Follow-up dependent on labs.    Reviewed expectations re: course of current medical issues.  Discussed self-management of symptoms.  Outlined signs and symptoms indicating need for more acute intervention.  Patient verbalized understanding and all questions were answered.  Patient received an After-Visit Summary.    No orders of the defined types were placed in this encounter.  > 15 minutes spent with patient, >50% of time spent face to face counseling   Note is dictated utilizing voice recognition software. Although note has been proof read prior to signing, occasional typographical errors still can be missed. If any questions arise, please do not hesitate to call for verification.   electronically signed by:  Felix Pacini, DO  [facility] - [place]

## 2018-09-13 ENCOUNTER — Other Ambulatory Visit: Payer: Self-pay | Admitting: Family Medicine

## 2018-09-13 LAB — IRON,TIBC AND FERRITIN PANEL
%SAT: 16 % (calc) (ref 16–45)
Ferritin: 43 ng/mL (ref 16–154)
Iron: 47 ug/dL (ref 40–190)
TIBC: 299 ug/dL (ref 250–450)

## 2018-09-13 MED ORDER — POLYSACCHARIDE IRON COMPLEX 150 MG PO CAPS: 150.0000 mg | ORAL_CAPSULE | ORAL | 3 refills | Status: DC

## 2018-09-13 MED FILL — FERREX 150 CAPSULE: 150 | 90 days supply | Qty: 45 | Fill #0

## 2018-11-15 MED FILL — CLINDAMYCIN PHOS-BENZOYL PE: 1-5 | 20 days supply | Qty: 50 | Fill #1

## 2018-12-07 ENCOUNTER — Other Ambulatory Visit: Payer: 59

## 2018-12-07 ENCOUNTER — Other Ambulatory Visit: Payer: Self-pay | Admitting: Family Medicine

## 2018-12-07 ENCOUNTER — Ambulatory Visit
Admission: RE | Admit: 2018-12-07 | Discharge: 2018-12-07 | Disposition: A | Discharge status: Home / Self Care | Payer: 59 | Source: Ambulatory Visit | Attending: Family Medicine | Admitting: Family Medicine

## 2018-12-07 DIAGNOSIS — N6311 Unspecified lump in the right breast, upper outer quadrant: Secondary | ICD-10-CM | POA: Diagnosis not present

## 2018-12-07 DIAGNOSIS — N6310 Unspecified lump in the right breast, unspecified quadrant: Secondary | ICD-10-CM

## 2018-12-07 DIAGNOSIS — N6312 Unspecified lump in the right breast, upper inner quadrant: Secondary | ICD-10-CM | POA: Diagnosis not present

## 2018-12-07 DIAGNOSIS — R922 Inconclusive mammogram: Secondary | ICD-10-CM | POA: Diagnosis not present

## 2018-12-07 MED FILL — DOXYCYCLINE HYCLATE 100 MG: 100 | 90 days supply | Qty: 90 | Fill #1

## 2018-12-07 MED FILL — FERREX 150 CAPSULE: 150 | 90 days supply | Qty: 45 | Fill #1

## 2019-02-08 DIAGNOSIS — Z6821 Body mass index (BMI) 21.0-21.9, adult: Secondary | ICD-10-CM | POA: Diagnosis not present

## 2019-02-08 DIAGNOSIS — Z01419 Encounter for gynecological examination (general) (routine) without abnormal findings: Secondary | ICD-10-CM | POA: Diagnosis not present

## 2019-02-08 DIAGNOSIS — Z3040 Encounter for surveillance of contraceptives, unspecified: Secondary | ICD-10-CM | POA: Diagnosis not present

## 2019-02-15 ENCOUNTER — Other Ambulatory Visit: Payer: Self-pay

## 2019-02-15 ENCOUNTER — Ambulatory Visit: Payer: 59 | Admitting: Family Medicine

## 2019-02-15 ENCOUNTER — Encounter: Payer: Self-pay | Admitting: Family Medicine

## 2019-02-15 VITALS — BP 120/62 | HR 64 | Temp 98.5°F | Wt 123.0 lb

## 2019-02-15 DIAGNOSIS — J029 Acute pharyngitis, unspecified: Secondary | ICD-10-CM | POA: Diagnosis not present

## 2019-02-15 DIAGNOSIS — R6889 Other general symptoms and signs: Secondary | ICD-10-CM

## 2019-02-15 LAB — POCT RAPID STREP A (OFFICE): Rapid Strep A Screen: NEGATIVE

## 2019-02-15 LAB — POC INFLUENZA A&B (BINAX/QUICKVUE)
Influenza A, POC: NEGATIVE
Influenza B, POC: NEGATIVE

## 2019-02-15 NOTE — Progress Notes (Signed)
Jennifer Mcdowell DOB: [birth year] Encounter date: 02/15/2019  This is a 38 y.o. female who presents with Chief Complaint  Patient presents with  . Sore Throat    x 4 days, body aches, dry cough    History of present illness:  Started with sore throat - constricting sensation. Body aches last night. No fevers. Just wanted to make sure no flu because she has a couple of kids at home.   Yesterday throat was much worse than today.   Now more dry cough. Coughs when she talks.   No known sick exposures.     No Known Allergies Current Meds  Medication Sig  . clindamycin-benzoyl peroxide (BENZACLIN) gel Apply topically at bedtime.  Marland Kitchen doxycycline (VIBRA-TABS) 100 MG tablet Take 1 tablet (100 mg total) by mouth daily.  . ferrous sulfate 325 (65 FE) MG EC tablet Take 325 mg by mouth daily.  . iron polysaccharides (NIFEREX) 150 MG capsule Take 1 capsule (150 mg total) by mouth every other day.    Review of Systems  Constitutional: Negative for chills, fatigue and fever.  HENT: Positive for sore throat. Negative for congestion.   Respiratory: Positive for cough. Negative for chest tightness, shortness of breath and wheezing.   Cardiovascular: Negative for chest pain.  Gastrointestinal: Negative for abdominal pain (minimal period cramping today.    Objective:  BP 120/62 (BP Location: Left Arm, Patient Position: Sitting, Cuff Size: Normal)   Pulse 64   Temp 98.5 F (36.9 C) (Oral)   Wt 123 lb (55.8 kg)   SpO2 99%   BMI 21.79 kg/m   Weight: 123 lb (55.8 kg)   BP Readings from Last 3 Encounters:  02/15/19 120/62  09/12/18 129/88  05/31/18 103/72   Wt Readings from Last 3 Encounters:  02/15/19 123 lb (55.8 kg)  09/12/18 123 lb (55.8 kg)  05/31/18 124 lb (56.2 kg)    Physical Exam Constitutional:      General: She is not in acute distress.    Appearance: She is well-developed.  HENT:     Right Ear: Tympanic membrane and ear canal normal.     Left Ear: Tympanic  membrane and ear canal normal.     Mouth/Throat:     Mouth: Mucous membranes are moist.     Pharynx: Posterior oropharyngeal erythema present. No oropharyngeal exudate.     Tonsils: No tonsillar exudate.  Cardiovascular:     Rate and Rhythm: Normal rate and regular rhythm.     Heart sounds: Normal heart sounds. No murmur. No friction rub.  Pulmonary:     Effort: Pulmonary effort is normal. No respiratory distress.     Breath sounds: Normal breath sounds. No wheezing or rales.  Musculoskeletal:     Right lower leg: No edema.     Left lower leg: No edema.  Lymphadenopathy:     Head:     Right side of head: Submandibular adenopathy present. No submental or occipital adenopathy.     Left side of head: Submandibular and occipital adenopathy present. No submental adenopathy.     Upper Body:     Right upper body: No supraclavicular adenopathy.     Left upper body: No supraclavicular adenopathy.  Neurological:     Mental Status: She is alert and oriented to person, place, and time.  Psychiatric:        Behavior: Behavior normal.     Assessment/Plan  1. Flu-like symptoms Flu testing negative in office today. - POC Influenza A&B(BINAX/QUICKVUE) Fluids, rest.  Let us know if any worsening of symptoms.   2. Pharyngitis: Rapid strep negative in office today.   Return if symptoms worsen or fail to improve.    Theodis Shove, MD

## 2019-03-22 ENCOUNTER — Encounter: Payer: 59 | Admitting: Family Medicine

## 2019-03-27 MED FILL — CLINDAMYCIN PHOS-BENZOYL PE: 1-5 | 20 days supply | Qty: 50 | Fill #2

## 2019-03-27 MED FILL — DOXYCYCLINE HYCLATE 100 MG: 100 | 90 days supply | Qty: 90 | Fill #2

## 2019-03-27 MED FILL — FERREX 150 CAPSULE: 150 | 90 days supply | Qty: 45 | Fill #2

## 2019-05-15 ENCOUNTER — Ambulatory Visit (INDEPENDENT_AMBULATORY_CARE_PROVIDER_SITE_OTHER): Payer: 59 | Admitting: Family Medicine

## 2019-05-15 ENCOUNTER — Encounter: Payer: 59 | Admitting: Family Medicine

## 2019-05-15 ENCOUNTER — Other Ambulatory Visit: Payer: Self-pay

## 2019-05-15 ENCOUNTER — Encounter: Payer: Self-pay | Admitting: Family Medicine

## 2019-05-15 VITALS — BP 110/73 | HR 51 | Temp 97.9°F | Resp 17 | Ht 64.25 in | Wt 124.0 lb

## 2019-05-15 DIAGNOSIS — Z0000 Encounter for general adult medical examination without abnormal findings: Secondary | ICD-10-CM

## 2019-05-15 DIAGNOSIS — Z131 Encounter for screening for diabetes mellitus: Secondary | ICD-10-CM

## 2019-05-15 DIAGNOSIS — L700 Acne vulgaris: Secondary | ICD-10-CM

## 2019-05-15 DIAGNOSIS — R928 Other abnormal and inconclusive findings on diagnostic imaging of breast: Secondary | ICD-10-CM

## 2019-05-15 DIAGNOSIS — D509 Iron deficiency anemia, unspecified: Secondary | ICD-10-CM | POA: Diagnosis not present

## 2019-05-15 DIAGNOSIS — Z79899 Other long term (current) drug therapy: Secondary | ICD-10-CM

## 2019-05-15 DIAGNOSIS — Z0001 Encounter for general adult medical examination with abnormal findings: Secondary | ICD-10-CM | POA: Diagnosis not present

## 2019-05-15 DIAGNOSIS — Z13220 Encounter for screening for lipoid disorders: Secondary | ICD-10-CM

## 2019-05-15 LAB — COMPREHENSIVE METABOLIC PANEL
ALT: 23 U/L (ref 0–35)
AST: 30 U/L (ref 0–37)
Albumin: 4.1 g/dL (ref 3.5–5.2)
Alkaline Phosphatase: 43 U/L (ref 39–117)
BUN: 6 mg/dL (ref 6–23)
CO2: 28 mEq/L (ref 19–32)
Calcium: 9 mg/dL (ref 8.4–10.5)
Chloride: 103 mEq/L (ref 96–112)
Creatinine, Ser: 0.57 mg/dL (ref 0.40–1.20)
GFR: 118.72 mL/min (ref >60.00)
Glucose, Bld: 78 mg/dL (ref 70–99)
Potassium: 3.7 mEq/L (ref 3.5–5.1)
Sodium: 138 mEq/L (ref 135–145)
Total Bilirubin: 0.7 mg/dL (ref 0.2–1.2)
Total Protein: 6.6 g/dL (ref 6.0–8.3)

## 2019-05-15 LAB — LIPID PANEL
Cholesterol: 152 mg/dL (ref 0–200)
HDL: 47.5 mg/dL (ref >39.00)
LDL Cholesterol: 86 mg/dL (ref 0–99)
NonHDL: 104.93
Total CHOL/HDL Ratio: 3
Triglycerides: 93 mg/dL (ref 0.0–149.0)
VLDL: 18.6 mg/dL (ref 0.0–40.0)

## 2019-05-15 LAB — HEMOGLOBIN A1C: Hgb A1c MFr Bld: 5.1 % (ref 4.6–6.5)

## 2019-05-15 LAB — CBC
HCT: 38.7 % (ref 36.0–46.0)
Hemoglobin: 12.7 g/dL (ref 12.0–15.0)
MCHC: 32.7 g/dL (ref 30.0–36.0)
MCV: 81.2 fl (ref 78.0–100.0)
Platelets: 296 10*3/uL (ref 150.0–400.0)
RBC: 4.76 Mil/uL (ref 3.87–5.11)
RDW: 13.2 % (ref 11.5–15.5)
WBC: 4.9 10*3/uL (ref 4.0–10.5)

## 2019-05-15 MED ORDER — POLYSACCHARIDE IRON COMPLEX 150 MG PO CAPS: 150.0000 mg | ORAL_CAPSULE | ORAL | 3 refills | Status: DC

## 2019-05-15 MED ORDER — CLINDAMYCIN PHOS-BENZOYL PEROX 1-5 % EX GEL: Freq: Every day | CUTANEOUS | 5 refills | Status: DC

## 2019-05-15 MED ORDER — DOXYCYCLINE HYCLATE 100 MG PO TABS: 100.0000 mg | ORAL_TABLET | Freq: Every day | ORAL | 3 refills | Status: DC

## 2019-05-15 MED FILL — CLINDAMYCIN PHOS-BENZOYL PE: 1-5 | 30 days supply | Qty: 50 | Fill #0

## 2019-05-15 MED FILL — FERREX 150 CAPSULE: 150 | 90 days supply | Qty: 45 | Fill #0

## 2019-05-15 NOTE — Patient Instructions (Signed)

## 2019-05-15 NOTE — Progress Notes (Signed)
Patient ID: Jennifer Mcdowell, female  DOB: 05-Jan-1981, 38 y.o.   MRN: [id number] Patient Care Team    Relationship Specialty Notifications Start End  Ma Hillock, DO PCP - General Family Medicine  03/16/18   Delsa Bern, MD Consulting Physician Obstetrics and Gynecology  03/16/18     Chief Complaint  Patient presents with  . Annual Exam    No concerns. Pt is fasting. [place] completed 2019. Mammogram June 2019.    Subjective:  Jennifer Mcdowell is a 38 y.o.  Female  present for CPE. All past medical history, surgical history, allergies, family history,updated immunizations, medications and social history were updated in the electronic medical record today. All recent labs, ED visits and hospitalizations within the last year were reviewed.  Cystic acne vulgaris Patient report her acne is doing well as long as she takes the doxycycline and uses the benzoyl and gel nightly.  She states that she misses any of the regimen of above she starts to have outbreaks again.  Iron deficiency anemia, unspecified iron deficiency anemia type She reports compliance with iron polysaccharide every other day.  She has always been asymptomatic with her iron deficiency anemia.  Health maintenance:  Colonoscopy: No Fhx, screen at 50 Mammogram: completed with right breast mass- next mam f/u 06/03/2019 scheduled.  MGM and MGGM both w/ breast cancer at <60.--> order placed today for early screen, she will check with insurance on coverage. .  Cervical cancer screening: last pap: 01/04/2018, results: HPV +, completed by: Dr. Cletis Media Immunizations: tdap UTD 2019, Influenza  (encouraged yearly) Infectious disease screening: HIV completed 2019 DEXA: screen at 60 Assistive device: none Oxygen ACZ:YSAY Patient has a Dental home. Hospitalizations/ED visits: reviewed  Depression screen [facility] 2/9 05/15/2019 03/16/2018  Decreased Interest 0 0  Down, Depressed, Hopeless 0 0  PHQ - 2 Score 0 0    No flowsheet data found.  Immunization History  Administered Date(s) Administered  . Influenza,inj,Quad PF,6+ Mos 09/10/2018  . Tdap 03/16/2018    Past Medical History:  Diagnosis Date  . Gestational diabetes    No Known Allergies History reviewed. No pertinent surgical history. Family History  Problem Relation Age of Onset  . Hypertension Father   . Breast cancer Maternal Grandmother 26       passed away at 63 in stage 4 at diagnosis.    Social History   Social History Narrative  . Not on file    Allergies as of 05/15/2019   No Known Allergies     Medication List       Accurate as of May 15, 2019  9:04 AM. If you have any questions, ask your nurse or doctor.        STOP taking these medications   ferrous sulfate 325 (65 FE) MG EC tablet Stopped by:  Howard Pouch, DO     TAKE these medications   clindamycin-benzoyl peroxide gel Commonly known as:  BENZACLIN Apply topically at bedtime.   doxycycline 100 MG tablet Commonly known as:  VIBRA-TABS Take 1 tablet (100 mg total) by mouth daily.   iron polysaccharides 150 MG capsule Commonly known as:  NIFEREX Take 1 capsule (150 mg total) by mouth every other day.       All past medical history, surgical history, allergies, family history, immunizations andmedications were updated in the EMR today and reviewed under the history and medication portions of their EMR.     Recent Results (from the past 2160  hour(s))  POC Influenza A&B(BINAX/QUICKVUE)     Status: None   Collection Time: 02/15/19  1:42 PM  Result Value Ref Range   Influenza A, POC Negative Negative   Influenza B, POC Negative Negative  POC Rapid Strep A     Status: None   Collection Time: 02/15/19  2:03 PM  Result Value Ref Range   Rapid Strep A Screen Negative Negative    [place] Breast Ltd Uni Right Inc Axilla  Result Date: 12/07/2018 CLINICAL DATA:  Six-month interval follow-up of a likely benign mass involving the UPPER RIGHT breast at  MIDDLE depth, possibly fibroadenoma. EXAM: DIGITAL DIAGNOSTIC RIGHT MAMMOGRAM WITH CAD AND TOMO ULTRASOUND RIGHT BREAST COMPARISON:  Previous exam(s). ACR Breast Density Category c: The breast tissue is heterogeneously dense, which may obscure small masses. FINDINGS: Tomosynthesis and synthesized full field CC and MLO views of the RIGHT breast were obtained. The circumscribed low-density mass in the UPPER RIGHT breast at MIDDLE depth measuring approximately 7-8 mm is unchanged in size and appearance since the baseline mammogram in June, 2019. No new or suspicious findings elsewhere in the RIGHT breast. Mammographic images were processed with CAD. Targeted RIGHT breast ultrasound is performed, showing the previously identified circumscribed oval parallel hypoechoic mass at the 12 o'clock position approximately 2 cm from the nipple at MIDDLE depth, measuring approximately 6 x 8 x 7 mm, unchanged from the 06/05/2018 ultrasound. 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 RECOMMENDATION: RIGHT breast ultrasound in 6 months at the time of annual BILATERAL diagnostic mammography. I have discussed the findings and recommendations with the patient. Results were also provided in writing at the conclusion of the visit. If applicable, a reminder letter will be sent to the patient regarding the next appointment. BI-RADS CATEGORY  3: Probably benign. Electronically Signed   By: Hulan Saashomas  Lawrence M.D.   On: 12/07/2018 10:47   Mm Diag Breast Tomo Uni Right  Result Date: 12/07/2018 CLINICAL DATA:  Six-month interval follow-up of a likely benign mass involving the UPPER RIGHT breast at MIDDLE depth, possibly fibroadenoma. EXAM: DIGITAL DIAGNOSTIC RIGHT MAMMOGRAM WITH CAD AND TOMO ULTRASOUND RIGHT BREAST COMPARISON:  Previous exam(s). ACR Breast Density Category c: The breast tissue is heterogeneously dense, which may obscure small masses. FINDINGS:  Tomosynthesis and synthesized full field CC and MLO views of the RIGHT breast were obtained. The circumscribed low-density mass in the UPPER RIGHT breast at MIDDLE depth measuring approximately 7-8 mm is unchanged in size and appearance since the baseline mammogram in June, 2019. No new or suspicious findings elsewhere in the RIGHT breast. Mammographic images were processed with CAD. Targeted RIGHT breast ultrasound is performed, showing the previously identified circumscribed oval parallel hypoechoic mass at the 12 o'clock position approximately 2 cm from the nipple at MIDDLE depth, measuring approximately 6 x 8 x 7 mm, unchanged from the 06/05/2018 ultrasound. 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 RECOMMENDATION: RIGHT breast ultrasound in 6 months at the time of annual BILATERAL diagnostic mammography. I have discussed the findings and recommendations with the patient. Results were also provided in writing at the conclusion of the visit. If applicable, a reminder letter will be sent to the patient regarding the next appointment. BI-RADS CATEGORY  3: Probably benign. Electronically Signed   By: Hulan Saashomas  Lawrence M.D.   On: 12/07/2018 10:47     ROS: 14 pt review of systems  performed and negative (unless mentioned in an HPI)  Objective: BP 110/73 (BP Location: Left Arm, Patient Position: Sitting, Cuff Size: Normal)   Pulse (!) 51   Temp 97.9 F (36.6 C) (Temporal)   Resp 17   Ht 5' 4.25" (1.632 m)   Wt 124 lb (56.2 kg)   LMP 05/08/2019 (Exact Date)   SpO2 100%   BMI 21.12 kg/m  Gen: Afebrile. No acute distress. Nontoxic in appearance, well-developed, well-nourished, very pleasant female. HENT: AT. [place]. Bilateral TM visualized and normal in appearance, normal external auditory canal. MMM, no oral lesions, adequate dentition. Bilateral nares within normal limits. Throat without erythema, ulcerations or exudates.  No cough  on exam, no hoarseness on exam. Eyes:Pupils Equal Round Reactive to light, Extraocular movements intact,  Conjunctiva without redness, discharge or icterus. Neck/lymp/endocrine: Supple, no lymphadenopathy, no thyromegaly CV: RRR no murmur, no edema, +2/4 P posterior tibialis pulses.  No carotid bruits. No JVD. Chest: CTAB, no wheeze, rhonchi or crackles.  Normal respiratory effort.  Good air movement. Abd: Soft.  Flat. NTND. BS present.  No masses palpated. No hepatosplenomegaly. No rebound tenderness or guarding. Skin: No rashes, purpura or petechiae. Warm and well-perfused. Skin intact. Neuro/Msk:  Normal gait. PERLA. EOMi. Alert. Oriented x3.  Cranial nerves II through XII intact. Muscle strength 5/5 upper/lower extremity. DTRs equal bilaterally. Psych: Normal affect, dress and demeanor. Normal speech. Normal thought content and judgment.  No exam data present  Assessment/plan: Jennifer Mcdowell is a 38 y.o. female present for CPE . Encounter for preventive health examination Patient was encouraged to exercise greater than 150 minutes a week. Patient was encouraged to choose a diet filled with fresh fruits and vegetables, and lean meats. AVS provided to patient today for education/recommendation on gender specific health and safety maintenance. Colonoscopy: No Fhx, screen at 50 Mammogram: completed with right breast mass- next mam f/u 06/03/2019 scheduled.  MGM and MGGM both w/ breast cancer at <60.--> order placed today for early screen, she will check with insurance on coverage. .  Cervical cancer screening: last pap: 01/04/2018, results: HPV +, completed by: Dr. Estanislado Pandyivard Immunizations: tdap UTD 2019, Influenza  (encouraged yearly) Infectious disease screening: HIV completed 2019 DEXA: screen at 60 Abnormal mammogram of right breast followup scheduled for June.  Cystic acne vulgaris Stable on current medication regimen refills provided today - doxycycline (VIBRA-TABS) 100 MG tablet; Take  1 tablet (100 mg total) by mouth daily.  Dispense: 90 tablet; Refill: 3 - clindamycin-benzoyl peroxide (BENZACLIN) gel; Apply topically at bedtime.  Dispense: 50 g; Refill: 5 - follow yearly with CPE Iron deficiency anemia, unspecified iron deficiency anemia type Stable. asymptomatic - CBC - iron polysaccharides (NIFEREX) 150 MG capsule; Take 1 capsule (150 mg total) by mouth every other day.  Dispense: 45 capsule; Refill: 3 - Iron, TIBC and Ferritin Panel Lipid screening - Lipid panel Diabetes mellitus screening - Hemoglobin A1c Encounter for long-term current use of medication - Comprehensive metabolic panel     Return in about 1 year (around 05/14/2020) for CPE (30 min).  Electronically signed by: Felix Pacinienee , DO [facility] [place]

## 2019-05-16 LAB — IRON,TIBC AND FERRITIN PANEL
%SAT: 34 % (calc) (ref 16–45)
Ferritin: 15 ng/mL — ABNORMAL LOW (ref 16–154)
Iron: 111 ug/dL (ref 40–190)
TIBC: 323 mcg/dL (calc) (ref 250–450)

## 2019-06-03 ENCOUNTER — Ambulatory Visit
Admission: RE | Admit: 2019-06-03 | Discharge: 2019-06-03 | Disposition: A | Discharge status: Home / Self Care | Payer: 59 | Source: Ambulatory Visit | Attending: Family Medicine | Admitting: Family Medicine

## 2019-06-03 ENCOUNTER — Other Ambulatory Visit: Payer: Self-pay

## 2019-06-03 DIAGNOSIS — N6310 Unspecified lump in the right breast, unspecified quadrant: Secondary | ICD-10-CM

## 2019-06-03 DIAGNOSIS — N6311 Unspecified lump in the right breast, upper outer quadrant: Secondary | ICD-10-CM | POA: Diagnosis not present

## 2019-06-03 DIAGNOSIS — N6312 Unspecified lump in the right breast, upper inner quadrant: Secondary | ICD-10-CM | POA: Diagnosis not present

## 2019-06-03 DIAGNOSIS — R922 Inconclusive mammogram: Secondary | ICD-10-CM | POA: Diagnosis not present

## 2019-06-04 MED FILL — valACYclovir HCL 1 GM TABS: 1 | 6 days supply | Qty: 12 | Fill #0

## 2019-07-02 MED FILL — DOXYCYCLINE HYCLATE 100 MG: 100 | 90 days supply | Qty: 90 | Fill #3

## 2019-07-02 MED FILL — FERREX 150 CAPSULE: 150 | 90 days supply | Qty: 45 | Fill #3

## 2019-07-09 MED FILL — valACYclovir HCL 1 GM TABS: 1 | 6 days supply | Qty: 12 | Fill #0

## 2019-08-14 ENCOUNTER — Encounter: Payer: Self-pay | Admitting: Family Medicine

## 2019-09-15 ENCOUNTER — Encounter: Payer: Self-pay | Admitting: Family Medicine

## 2019-09-16 DIAGNOSIS — Z03818 Encounter for observation for suspected exposure to other biological agents ruled out: Secondary | ICD-10-CM | POA: Diagnosis not present

## 2019-09-30 ENCOUNTER — Other Ambulatory Visit: Payer: Self-pay

## 2019-09-30 ENCOUNTER — Ambulatory Visit (INDEPENDENT_AMBULATORY_CARE_PROVIDER_SITE_OTHER): Payer: 59

## 2019-09-30 DIAGNOSIS — Z23 Encounter for immunization: Secondary | ICD-10-CM | POA: Diagnosis not present

## 2019-10-29 MED FILL — DOXYCYCLINE HYCLATE 100 MG: 100 | 90 days supply | Qty: 90 | Fill #0

## 2019-10-29 MED FILL — FERREX 150 CAPSULE: 150 | 90 days supply | Qty: 45 | Fill #0

## 2020-02-06 MED FILL — FERREX 150 CAPSULE: 150 | 90 days supply | Qty: 45 | Fill #1

## 2020-02-06 MED FILL — CLINDAMYCIN PHOS-BENZOYL PE: 1-5 | 30 days supply | Qty: 50 | Fill #0

## 2020-02-12 ENCOUNTER — Other Ambulatory Visit (HOSPITAL_COMMUNITY): Payer: Self-pay | Admitting: Obstetrics and Gynecology

## 2020-02-12 DIAGNOSIS — Z6821 Body mass index (BMI) 21.0-21.9, adult: Secondary | ICD-10-CM | POA: Diagnosis not present

## 2020-02-12 DIAGNOSIS — Z01419 Encounter for gynecological examination (general) (routine) without abnormal findings: Secondary | ICD-10-CM | POA: Diagnosis not present

## 2020-02-12 DIAGNOSIS — Z3040 Encounter for surveillance of contraceptives, unspecified: Secondary | ICD-10-CM | POA: Diagnosis not present

## 2020-02-12 MED FILL — ETONOGESTREL-ETHINYL ESTRAD: 0.12-0.015 | 84 days supply | Qty: 3 | Fill #0

## 2020-02-27 DIAGNOSIS — Z20828 Contact with and (suspected) exposure to other viral communicable diseases: Secondary | ICD-10-CM | POA: Diagnosis not present

## 2020-04-23 DIAGNOSIS — L700 Acne vulgaris: Secondary | ICD-10-CM | POA: Diagnosis not present

## 2020-04-23 MED FILL — SPIRONOLACTONE 100 MG TAB: 100 | 30 days supply | Qty: 30 | Fill #0

## 2020-05-11 ENCOUNTER — Other Ambulatory Visit: Payer: Self-pay | Admitting: Family Medicine

## 2020-05-11 DIAGNOSIS — N6310 Unspecified lump in the right breast, unspecified quadrant: Secondary | ICD-10-CM

## 2020-05-18 MED FILL — SPIRONOLACTONE 100 MG TAB: 100 | 30 days supply | Qty: 30 | Fill #1

## 2020-05-19 ENCOUNTER — Encounter: Payer: 59 | Admitting: Family Medicine

## 2020-05-26 ENCOUNTER — Ambulatory Visit (INDEPENDENT_AMBULATORY_CARE_PROVIDER_SITE_OTHER): Payer: 59 | Admitting: Family Medicine

## 2020-05-26 ENCOUNTER — Other Ambulatory Visit (HOSPITAL_COMMUNITY): Payer: Self-pay | Admitting: Family Medicine

## 2020-05-26 ENCOUNTER — Other Ambulatory Visit: Payer: Self-pay

## 2020-05-26 ENCOUNTER — Encounter: Payer: Self-pay | Admitting: Family Medicine

## 2020-05-26 VITALS — BP 124/86 | HR 67 | Temp 98.0°F | Resp 16 | Ht 64.0 in | Wt 122.0 lb

## 2020-05-26 DIAGNOSIS — Z0000 Encounter for general adult medical examination without abnormal findings: Secondary | ICD-10-CM

## 2020-05-26 DIAGNOSIS — D509 Iron deficiency anemia, unspecified: Secondary | ICD-10-CM | POA: Diagnosis not present

## 2020-05-26 DIAGNOSIS — R252 Cramp and spasm: Secondary | ICD-10-CM | POA: Diagnosis not present

## 2020-05-26 DIAGNOSIS — Z793 Long term (current) use of hormonal contraceptives: Secondary | ICD-10-CM | POA: Diagnosis not present

## 2020-05-26 DIAGNOSIS — L700 Acne vulgaris: Secondary | ICD-10-CM

## 2020-05-26 DIAGNOSIS — Z131 Encounter for screening for diabetes mellitus: Secondary | ICD-10-CM | POA: Diagnosis not present

## 2020-05-26 LAB — CBC
HCT: 39.2 % (ref 36.0–46.0)
Hemoglobin: 13 g/dL (ref 12.0–15.0)
MCHC: 33.2 g/dL (ref 30.0–36.0)
MCV: 80.1 fl (ref 78.0–100.0)
Platelets: 380 10*3/uL (ref 150.0–400.0)
RBC: 4.89 Mil/uL (ref 3.87–5.11)
RDW: 13.2 % (ref 11.5–15.5)
WBC: 6 10*3/uL (ref 4.0–10.5)

## 2020-05-26 LAB — COMPREHENSIVE METABOLIC PANEL
ALT: 10 U/L (ref 0–35)
AST: 15 U/L (ref 0–37)
Albumin: 3.9 g/dL (ref 3.5–5.2)
Alkaline Phosphatase: 40 U/L (ref 39–117)
BUN: 9 mg/dL (ref 6–23)
CO2: 28 mEq/L (ref 19–32)
Calcium: 9.3 mg/dL (ref 8.4–10.5)
Chloride: 103 mEq/L (ref 96–112)
Creatinine, Ser: 0.63 mg/dL (ref 0.40–1.20)
GFR: 105.19 mL/min (ref >60.00)
Glucose, Bld: 76 mg/dL (ref 70–99)
Potassium: 4.4 mEq/L (ref 3.5–5.1)
Sodium: 137 mEq/L (ref 135–145)
Total Bilirubin: 0.5 mg/dL (ref 0.2–1.2)
Total Protein: 6.8 g/dL (ref 6.0–8.3)

## 2020-05-26 LAB — LIPID PANEL
Cholesterol: 125 mg/dL (ref 0–200)
HDL: 53.7 mg/dL (ref >39.00)
LDL Cholesterol: 52 mg/dL (ref 0–99)
NonHDL: 71.79
Total CHOL/HDL Ratio: 2
Triglycerides: 98 mg/dL (ref 0.0–149.0)
VLDL: 19.6 mg/dL (ref 0.0–40.0)

## 2020-05-26 LAB — TSH: TSH: 2.56 u[IU]/mL (ref 0.35–4.50)

## 2020-05-26 LAB — HEMOGLOBIN A1C: Hgb A1c MFr Bld: 5.2 % (ref 4.6–6.5)

## 2020-05-26 MED ORDER — CLINDAMYCIN PHOS-BENZOYL PEROX 1-5 % EX GEL: Freq: Every day | CUTANEOUS | 5 refills | Status: DC

## 2020-05-26 MED ORDER — POLYSACCHARIDE IRON COMPLEX 150 MG PO CAPS: 150.0000 mg | ORAL_CAPSULE | ORAL | 3 refills | Status: DC

## 2020-05-26 MED FILL — CLINDAMYCIN PHOS-BENZOYL PE: 1-5 | 30 days supply | Qty: 50 | Fill #0

## 2020-05-26 NOTE — Patient Instructions (Signed)
Health Maintenance, Female Adopting a healthy lifestyle and getting preventive care are important in promoting health and wellness. Ask your health care provider about:  The right schedule for you to have regular tests and exams.  Things you can do on your own to prevent diseases and keep yourself healthy. What should I know about diet, weight, and exercise? Eat a healthy diet   Eat a diet that includes plenty of vegetables, fruits, low-fat dairy products, and lean protein.  Do not eat a lot of foods that are high in solid fats, added sugars, or sodium. Maintain a healthy weight Body mass index (BMI) is used to identify weight problems. It estimates body fat based on height and weight. Your health care provider can help determine your BMI and help you achieve or maintain a healthy weight. Get regular exercise Get regular exercise. This is one of the most important things you can do for your health. Most adults should:  Exercise for at least 150 minutes each week. The exercise should increase your heart rate and make you sweat (moderate-intensity exercise).  Do strengthening exercises at least twice a week. This is in addition to the moderate-intensity exercise.  Spend less time sitting. Even light physical activity can be beneficial. Watch cholesterol and blood lipids Have your blood tested for lipids and cholesterol at 39 years of age, then have this test every 5 years. Have your cholesterol levels checked more often if:  Your lipid or cholesterol levels are high.  You are older than 40 years of age.  You are at high risk for heart disease. What should I know about cancer screening? Depending on your health history and family history, you may need to have cancer screening at various ages. This may include screening for:  Breast cancer.  Cervical cancer.  Colorectal cancer.  Skin cancer.  Lung cancer. What should I know about heart disease, diabetes, and high blood  pressure? Blood pressure and heart disease  High blood pressure causes heart disease and increases the risk of stroke. This is more likely to develop in people who have high blood pressure readings, are of African descent, or are overweight.  Have your blood pressure checked: ? Every 3-5 years if you are 18-39 years of age. ? Every year if you are 40 years old or older. Diabetes Have regular diabetes screenings. This checks your fasting blood sugar level. Have the screening done:  Once every three years after age 40 if you are at a normal weight and have a low risk for diabetes.  More often and at a younger age if you are overweight or have a high risk for diabetes. What should I know about preventing infection? Hepatitis B If you have a higher risk for hepatitis B, you should be screened for this virus. Talk with your health care provider to find out if you are at risk for hepatitis B infection. Hepatitis C Testing is recommended for:  Everyone born from 1945 through 1965.  Anyone with known risk factors for hepatitis C. Sexually transmitted infections (STIs)  Get screened for STIs, including gonorrhea and chlamydia, if: ? You are sexually active and are younger than 39 years of age. ? You are older than 39 years of age and your health care provider tells you that you are at risk for this type of infection. ? Your sexual activity has changed since you were last screened, and you are at increased risk for chlamydia or gonorrhea. Ask your health care provider if   you are at risk.  Ask your health care provider about whether you are at high risk for HIV. Your health care provider may recommend a prescription medicine to help prevent HIV infection. If you choose to take medicine to prevent HIV, you should first get tested for HIV. You should then be tested every 3 months for as long as you are taking the medicine. Pregnancy  If you are about to stop having your period (premenopausal) and  you may become pregnant, seek counseling before you get pregnant.  Take 400 to 800 micrograms (mcg) of folic acid every day if you become pregnant.  Ask for birth control (contraception) if you want to prevent pregnancy. Osteoporosis and menopause Osteoporosis is a disease in which the bones lose minerals and strength with aging. This can result in bone fractures. If you are 65 years old or older, or if you are at risk for osteoporosis and fractures, ask your health care provider if you should:  Be screened for bone loss.  Take a calcium or vitamin D supplement to lower your risk of fractures.  Be given hormone replacement therapy (HRT) to treat symptoms of menopause. Follow these instructions at home: Lifestyle  Do not use any products that contain nicotine or tobacco, such as cigarettes, e-cigarettes, and chewing tobacco. If you need help quitting, ask your health care provider.  Do not use street drugs.  Do not share needles.  Ask your health care provider for help if you need support or information about quitting drugs. Alcohol use  Do not drink alcohol if: ? Your health care provider tells you not to drink. ? You are pregnant, may be pregnant, or are planning to become pregnant.  If you drink alcohol: ? Limit how much you use to 0-1 drink a day. ? Limit intake if you are breastfeeding.  Be aware of how much alcohol is in your drink. In the [place] one drink equals one 12 oz bottle of beer (355 mL), one 5 oz glass of wine (148 mL), or one 1 oz glass of hard liquor (44 mL). General instructions  Schedule regular health, dental, and eye exams.  Stay current with your vaccines.  Tell your health care provider if: ? You often feel depressed. ? You have ever been abused or do not feel safe at home. Summary  Adopting a healthy lifestyle and getting preventive care are important in promoting health and wellness.  Follow your health care provider's instructions about healthy  diet, exercising, and getting tested or screened for diseases.  Follow your health care provider's instructions on monitoring your cholesterol and blood pressure. This information is not intended to replace advice given to you by your health care provider. Make sure you discuss any questions you have with your health care provider. Document Revised: 11/14/2018 Document Reviewed: 11/14/2018 Elsevier Patient Education  2020 Elsevier Inc.  

## 2020-05-26 NOTE — Progress Notes (Signed)
This visit occurred during the SARS-CoV-2 public health emergency.  Safety protocols were in place, including screening questions prior to the visit, additional usage of staff PPE, and extensive cleaning of exam room while observing appropriate contact time as indicated for disinfecting solutions.    Patient ID: Jennifer Mcdowell, female  DOB: 05/03/1981, 39 y.o.   MRN: [id number] Patient Care Team    Relationship Specialty Notifications Start End  Natalia Leatherwood, DO PCP - General Family Medicine  03/16/18   Silverio Lay, MD Consulting Physician Obstetrics and Gynecology  03/16/18     Chief Complaint  Patient presents with  . Annual Exam    Not fasting. Pap Smear 2019. Mammogram 2020.     Subjective:  Jennifer Mcdowell is a 39 y.o.  Female  present for CPE. All past medical history, surgical history, allergies, family history, immunizations, medications and social history were updated in the electronic medical record today. All recent labs, ED visits and hospitalizations within the last year were reviewed.  Health maintenance:  Colonoscopy:No Fhx, screen at 89 Mammogram: 06/03/2019.  MGM and MGGM both w/ breast cancer at [place]    - IMPRESSION: 1. Stable, probably benign right breast mass. Recommendation is for a final ultrasound follow-up in 1 year to coincide with the patient's annual bilateral mammogram. 2. No mammographic evidence of malignancy on the left Cervical cancer screening: last pap:2/15//2019 Immunizations: tdapUTD 2019, Influenza(encouraged yearly). COVID series completed. Infectious disease screening: HIVcompleted 2019 DEXA:screen at 60 Assistive device: none Oxygen EGB:TDVV Patient has a Dental home. Hospitalizations/ED visits: reviewed    Depression screen [facility] 2/9 05/26/2020 05/15/2019 03/16/2018  Decreased Interest 0 0 0  Down, Depressed, Hopeless 0 0 0  PHQ - 2 Score 0 0 0   No flowsheet data found.    Immunization History  Administered  Date(s) Administered  . Influenza,inj,Quad PF,6+ Mos 09/10/2018, 09/30/2019  . PFIZER SARS-COV-2 Vaccination 11/14/2019, 12/15/2019  . Tdap 03/16/2018     Past Medical History:  Diagnosis Date  . Gestational diabetes    No Known Allergies History reviewed. No pertinent surgical history. Family History  Problem Relation Age of Onset  . Hypertension Father   . Breast cancer Maternal Grandmother 55       passed away at 55 in stage 4 at diagnosis.    Social History   Social History Narrative  . Not on file    Allergies as of 05/26/2020   No Known Allergies     Medication List       Accurate as of May 26, 2020 10:55 AM. If you have any questions, ask your nurse or doctor.        STOP taking these medications   doxycycline 100 MG tablet Commonly known as: VIBRA-TABS Stopped by: Felix Pacini, DO     TAKE these medications   clindamycin-benzoyl peroxide gel Commonly known as: BENZACLIN Apply topically at bedtime.   etonogestrel-ethinyl estradiol 0.12-0.015 MG/24HR vaginal ring Commonly known as: NUVARING Place vaginally.   iron polysaccharides 150 MG capsule Commonly known as: NIFEREX Take 1 capsule (150 mg total) by mouth every other day.   MULTIVITAMIN ADULT PO Take by mouth.   spironolactone 100 MG tablet Commonly known as: ALDACTONE Take 100 mg by mouth daily.       All past medical history, surgical history, allergies, family history, immunizations andmedications were updated in the EMR today and reviewed under the history and medication portions of their EMR.     No results found for  this or any previous visit (from the past 2160 hour(s)).    ROS: 14 pt review of systems performed and negative (unless mentioned in an HPI)  Objective: BP 124/86 (BP Location: Right Arm, Patient Position: Sitting, Cuff Size: Normal)   Pulse 67   Temp 98 F (36.7 C) (Temporal)   Resp 16   Ht 5\' 4"  (1.626 m)   Wt 122 lb (55.3 kg)   LMP 04/26/2020 (Approximate)    SpO2 100%   BMI 20.94 kg/m  Gen: Afebrile. No acute distress. Nontoxic in appearance, well-developed, well-nourished,  Pleasant female.  HENT: AT. [place]. Bilateral TM visualized and normal in appearance, normal external auditory canal. MMM, no oral lesions, adequate dentition. Bilateral nares within normal limits. Throat without erythema, ulcerations or exudates. no Cough on exam, no hoarseness on exam. Eyes:Pupils Equal Round Reactive to light, Extraocular movements intact,  Conjunctiva without redness, discharge or icterus. Neck/lymp/endocrine: Supple,no lymphadenopathy, no thyromegaly CV: RRR no murmur, no edema, +2/4 P posterior tibialis pulses. no carotid bruits. No JVD. Chest: CTAB, no wheeze, rhonchi or crackles. normal Respiratory effort. [employer] movement. Abd: Soft. flat. NTND. BS present. no Masses palpated. No hepatosplenomegaly. No rebound tenderness or guarding. Skin: no rashes, purpura or petechiae. Warm and well-perfused. Skin intact. Neuro/Msk:  Normal gait. PERLA. EOMi. Alert. Oriented x3.  Cranial nerves II through XII intact. Muscle strength 5/5 upper/lower extremity. DTRs equal bilaterally. Psych: Normal affect, dress and demeanor. Normal speech. Normal thought content and judgment.   No exam data present  Assessment/plan: Jennifer Mcdowell is a 39 y.o. female present for CPE Iron deficiency anemia, unspecified iron deficiency anemia type Taking oral iron QD - CBC - Iron, TIBC and Ferritin Panel - iron polysaccharides (NIFEREX) 150 MG capsule; Take 1 capsule (150 mg total) by mouth every other day.  Dispense: 45 capsule; Refill: 3 Long term current use of hormonal contraceptive - Comprehensive metabolic panel - Lipid panel - TSH Diabetes mellitus screening - Hemoglobin A1c Cystic acne vulgaris - now seeing derm and prescribed spirolactone. Stated about 4 weeks ago.  - clindamycin-benzoyl peroxide (BENZACLIN) gel; Apply topically at bedtime.  Dispense: 50 g;  Refill: 5 Hand cramps possibly related to iron levels or start spiro. Encouraged hydration  Will check electrolytes on spiro - Comprehensive metabolic panel - TSH Encounter for preventive health examination Patient was encouraged to exercise greater than 150 minutes a week. Patient was encouraged to choose a diet filled with fresh fruits and vegetables, and lean meats. AVS provided to patient today for education/recommendation on gender specific health and safety maintenance. Colonoscopy:No Fhx, screen at 14 Mammogram: 06/03/2019.  MGM and MGGM both w/ breast cancer at [facility]. Pt had a stable right breast mass last year. She is scheduled for Mam and [place] this month.  Immunizations: tdapUTD 2019, Influenza(encouraged yearly). COVID series completed. Infectious disease screening: HIVcompleted 2019 DEXA:screen at 60   Return in about 1 year (around 05/26/2021) for CPE (30 min).  Orders Placed This Encounter  Procedures  . CBC  . Comprehensive metabolic panel  . Hemoglobin A1c  . Lipid panel  . TSH  . Iron, TIBC and Ferritin Panel   Meds ordered this encounter  Medications  . iron polysaccharides (NIFEREX) 150 MG capsule    Sig: Take 1 capsule (150 mg total) by mouth every other day.    Dispense:  45 capsule    Refill:  3  . clindamycin-benzoyl peroxide (BENZACLIN) gel    Sig: Apply topically at bedtime.  Dispense:  50 g    Refill:  5   Referral Orders  No referral(s) requested today     Electronically signed by: Felix Pacini, DO [facility] [place]

## 2020-05-27 LAB — IRON,TIBC AND FERRITIN PANEL
%SAT: 17 % (calc) (ref 16–45)
Ferritin: 15 ng/mL — ABNORMAL LOW (ref 16–154)
Iron: 72 ug/dL (ref 40–190)
TIBC: 431 mcg/dL (calc) (ref 250–450)

## 2020-06-03 ENCOUNTER — Other Ambulatory Visit: Payer: Self-pay

## 2020-06-03 ENCOUNTER — Ambulatory Visit
Admission: RE | Admit: 2020-06-03 | Discharge: 2020-06-03 | Disposition: A | Discharge status: Home / Self Care | Payer: 59 | Source: Ambulatory Visit | Attending: Family Medicine | Admitting: Family Medicine

## 2020-06-03 DIAGNOSIS — R922 Inconclusive mammogram: Secondary | ICD-10-CM | POA: Diagnosis not present

## 2020-06-03 DIAGNOSIS — N6312 Unspecified lump in the right breast, upper inner quadrant: Secondary | ICD-10-CM | POA: Diagnosis not present

## 2020-06-03 DIAGNOSIS — N6310 Unspecified lump in the right breast, unspecified quadrant: Secondary | ICD-10-CM

## 2020-06-03 DIAGNOSIS — N6311 Unspecified lump in the right breast, upper outer quadrant: Secondary | ICD-10-CM | POA: Diagnosis not present

## 2020-06-15 MED FILL — SPIRONOLACTONE 100 MG TAB: 100 | 30 days supply | Qty: 30 | Fill #1

## 2020-07-30 DIAGNOSIS — L700 Acne vulgaris: Secondary | ICD-10-CM | POA: Diagnosis not present

## 2020-07-30 MED FILL — ETONOGESTREL-ETHINYL ESTRAD: 0.12-0.015 | 84 days supply | Qty: 3 | Fill #2

## 2020-07-30 MED FILL — SPIRONOLACTONE 100 MG TAB: 100 | 30 days supply | Qty: 30 | Fill #0

## 2020-09-03 ENCOUNTER — Other Ambulatory Visit (HOSPITAL_COMMUNITY): Payer: Self-pay | Admitting: Dermatology

## 2020-09-03 DIAGNOSIS — L700 Acne vulgaris: Secondary | ICD-10-CM | POA: Diagnosis not present

## 2020-09-03 DIAGNOSIS — Z79899 Other long term (current) drug therapy: Secondary | ICD-10-CM | POA: Diagnosis not present

## 2020-09-14 MED FILL — FERREX 150 CAPSULE: 150 | 90 days supply | Qty: 45 | Fill #0

## 2020-09-15 DIAGNOSIS — Z79899 Other long term (current) drug therapy: Secondary | ICD-10-CM | POA: Diagnosis not present

## 2020-09-22 DIAGNOSIS — Z79899 Other long term (current) drug therapy: Secondary | ICD-10-CM | POA: Diagnosis not present

## 2020-09-22 DIAGNOSIS — L700 Acne vulgaris: Secondary | ICD-10-CM | POA: Diagnosis not present

## 2020-09-22 MED FILL — MYORISAN 40 MG CAPSULE: 40 | 30 days supply | Qty: 30 | Fill #0

## 2020-09-28 DIAGNOSIS — Z20822 Contact with and (suspected) exposure to covid-19: Secondary | ICD-10-CM | POA: Diagnosis not present

## 2020-10-19 ENCOUNTER — Other Ambulatory Visit (HOSPITAL_COMMUNITY): Payer: Self-pay | Admitting: Dermatology

## 2020-10-19 DIAGNOSIS — L700 Acne vulgaris: Secondary | ICD-10-CM | POA: Diagnosis not present

## 2020-10-19 DIAGNOSIS — Z20822 Contact with and (suspected) exposure to covid-19: Secondary | ICD-10-CM | POA: Diagnosis not present

## 2020-10-19 MED FILL — MYORISAN 40 MG CAPSULE: 40 | 30 days supply | Qty: 30 | Fill #0

## 2020-10-19 MED FILL — ETONOGESTREL-ETHINYL ESTRAD: 0.12-0.015 | 84 days supply | Qty: 3 | Fill #3

## 2020-11-19 ENCOUNTER — Other Ambulatory Visit (HOSPITAL_COMMUNITY): Payer: Self-pay | Admitting: Dermatology

## 2020-11-19 DIAGNOSIS — L700 Acne vulgaris: Secondary | ICD-10-CM | POA: Diagnosis not present

## 2020-11-23 MED FILL — MYORISAN 30 MG CAPSULE: 30 | 30 days supply | Qty: 60 | Fill #0

## 2020-12-17 DIAGNOSIS — Z1152 Encounter for screening for COVID-19: Secondary | ICD-10-CM | POA: Diagnosis not present

## 2020-12-25 MED FILL — FERREX 150 CAPSULE: 150 | 90 days supply | Qty: 45 | Fill #1

## 2020-12-28 ENCOUNTER — Other Ambulatory Visit (HOSPITAL_COMMUNITY): Payer: Self-pay | Admitting: Dermatology

## 2020-12-28 DIAGNOSIS — L2089 Other atopic dermatitis: Secondary | ICD-10-CM | POA: Diagnosis not present

## 2020-12-28 DIAGNOSIS — L700 Acne vulgaris: Secondary | ICD-10-CM | POA: Diagnosis not present

## 2020-12-28 MED FILL — TRIAMCINOLONE 0.1% OINTMENT: 0.1 | 30 days supply | Qty: 80 | Fill #0

## 2020-12-29 MED FILL — MYORISAN 30 MG CAPSULE: 30 | 30 days supply | Qty: 60 | Fill #0

## 2021-01-04 ENCOUNTER — Other Ambulatory Visit: Payer: Self-pay

## 2021-01-06 ENCOUNTER — Ambulatory Visit (INDEPENDENT_AMBULATORY_CARE_PROVIDER_SITE_OTHER): Payer: 59 | Admitting: Family Medicine

## 2021-01-06 ENCOUNTER — Other Ambulatory Visit: Payer: Self-pay

## 2021-01-06 ENCOUNTER — Encounter: Payer: Self-pay | Admitting: Family Medicine

## 2021-01-06 VITALS — BP 109/72 | HR 63 | Temp 98.4°F | Ht 64.0 in | Wt 121.0 lb

## 2021-01-06 DIAGNOSIS — S161XXA Strain of muscle, fascia and tendon at neck level, initial encounter: Secondary | ICD-10-CM

## 2021-01-06 MED ORDER — CYCLOBENZAPRINE HCL 5 MG PO TABS: 5.0000 mg | ORAL_TABLET | Freq: Two times a day (BID) | ORAL | 0 refills | Status: DC | PRN

## 2021-01-06 MED ORDER — NAPROXEN 500 MG PO TABS: 500.0000 mg | ORAL_TABLET | Freq: Two times a day (BID) | ORAL | 0 refills | Status: DC

## 2021-01-06 NOTE — Progress Notes (Signed)
This visit occurred during the SARS-CoV-2 public health emergency.  Safety protocols were in place, including screening questions prior to the visit, additional usage of staff PPE, and extensive cleaning of exam room while observing appropriate contact time as indicated for disinfecting solutions.    Jennifer Mcdowell , 1981/09/08, 40 y.o., female MRN: [id number] Patient Care Team    Relationship Specialty Notifications Start End  Natalia Leatherwood, DO PCP - General Family Medicine  03/16/18   Silverio Lay, MD Consulting Physician Obstetrics and Gynecology  03/16/18     Chief Complaint  Patient presents with  . Neck Pain    Pt c/o left side neck pain x 1 week; worsen throughout the day and limits ROM      Subjective: Pt presents for an OV with complaints of left sided neck pain of 1 week duration.  Associated symptoms include pain seems to worsen throughout the day (bc she takes a motrin before bed). She reports she had not changed her activity or lifted anything heavy. The pain was present when she woke up. She does not recall an injury or strain.  She reports tilting her head back or attempting to look over her shoulder is painful.  She has never had a neck injury in the past or neck surgery.   Pt has tried tylenol, motrin to ease their symptoms.   Depression screen [facility] 2/9 01/06/2021 05/26/2020 05/15/2019 03/16/2018  Decreased Interest 0 0 0 0  Down, Depressed, Hopeless 0 0 0 0  PHQ - 2 Score 0 0 0 0    No Known Allergies Social History   Social History Narrative  . Not on file   Past Medical History:  Diagnosis Date  . Gestational diabetes    History reviewed. No pertinent surgical history. Family History  Problem Relation Age of Onset  . Hypertension Father   . Breast cancer Maternal Grandmother 55       passed away at 55 in stage 4 at diagnosis.    Allergies as of 01/06/2021   No Known Allergies     Medication List       Accurate as of January 06, 2021  11:24 AM. If you have any questions, ask your nurse or doctor.        clindamycin-benzoyl peroxide gel Commonly known as: BENZACLIN Apply topically at bedtime.   cyclobenzaprine 5 MG tablet Commonly known as: FLEXERIL Take 1 tablet (5 mg total) by mouth 2 (two) times daily as needed for muscle spasms. Started by: Felix Pacini, DO   etonogestrel-ethinyl estradiol 0.12-0.015 MG/24HR vaginal ring Commonly known as: NUVARING Place vaginally.   iron polysaccharides 150 MG capsule Commonly known as: NIFEREX Take 1 capsule (150 mg total) by mouth every other day.   MULTIVITAMIN ADULT PO Take by mouth.   Myorisan 30 MG capsule Generic drug: ISOtretinoin Take 60 mg by mouth daily.   naproxen 500 MG tablet Commonly known as: Naprosyn Take 1 tablet (500 mg total) by mouth 2 (two) times daily with a meal. Started by: Felix Pacini, DO   spironolactone 100 MG tablet Commonly known as: ALDACTONE Take 100 mg by mouth daily.       All past medical history, surgical history, allergies, family history, immunizations andmedications were updated in the EMR today and reviewed under the history and medication portions of their EMR.     ROS: Negative, with the exception of above mentioned in HPI   Objective:  BP 109/72   Pulse 63  Temp 98.4 F (36.9 C) (Oral)   Ht 5\' 4"  (1.626 m)   Wt 121 lb (54.9 kg)   SpO2 98%   BMI 20.77 kg/m  Body mass index is 20.77 kg/m. Gen: Afebrile. No acute distress. Nontoxic in appearance, well developed, well nourished.  HENT: AT. [place].Eyes:Pupils Equal Round Reactive to light, Extraocular movements intact,  Conjunctiva without redness, discharge or icterus. Neck/lymp/endocrine: Supple,no lymphadenopathy MSK(cervical spine): no erythema, severe ropiness/cord left paracervical muscle group with tenderness. FROM with the exception of pain at the end of normal right rotation and pain beginning of left rotations. SB is painful. Extension is very painful. NV  intact distally.  Neuro: Normal gait. PERLA. EOMi. Alert. Oriented x3  Psych: Normal affect, dress and demeanor. Normal speech. Normal thought content and judgment.  No exam data present No results found. No results found for this or any previous visit (from the past 24 hour(s)).  Assessment/Plan: [name] is a 40 y.o. female present for OV for  Neck strain, initial encounter Heating pad can be helpful Light massage and stretches encouraged after a few days of medications.  Start naproxen BID with food for at least 5 days.  Start flexeril QHS for at least 5 days, can try day dose (or 1/2 dose) if not driving.  F/u 2 weeks if no improvement, sooner if worsening.    Reviewed expectations re: course of current medical issues.  Discussed self-management of symptoms.  Outlined signs and symptoms indicating need for more acute intervention.  Patient verbalized understanding and all questions were answered.  Patient received an After-Visit Summary.    No orders of the defined types were placed in this encounter.  Meds ordered this encounter  Medications  . cyclobenzaprine (FLEXERIL) 5 MG tablet    Sig: Take 1 tablet (5 mg total) by mouth 2 (two) times daily as needed for muscle spasms.    Dispense:  60 tablet    Refill:  0  . naproxen (NAPROSYN) 500 MG tablet    Sig: Take 1 tablet (500 mg total) by mouth 2 (two) times daily with a meal.    Dispense:  30 tablet    Refill:  0   Referral Orders  No referral(s) requested today    > 30 Minutes was dedicated to this patient's encounter to include pre-visit review of chart, face-to-face time with patient and post-visit work- which include documentation and prescribing medications and/or ordering test when necessary.    Note is dictated utilizing voice recognition software. Although note has been proof read prior to signing, occasional typographical errors still can be missed. If any questions arise, please do not  hesitate to call for verification.   electronically signed by:  24, DO  [facility] [place]

## 2021-01-06 NOTE — Patient Instructions (Signed)
Try biofreeze gel (over the counter)  Start naproxen every 12 hours with food for 5 days. Then you can use every 12 hours if needed.   Start flexeril 1 tab before bed for 5 days. If you can tolerate it, you can take 1/2- 1 tab during day as well.   Heating pad, massage and stretches to start in a few days.    Cervical Strain and Sprain Rehab Ask your health care provider which exercises are safe for you. Do exercises exactly as told by your health care provider and adjust them as directed. It is normal to feel mild stretching, pulling, tightness, or discomfort as you do these exercises. Stop right away if you feel sudden pain or your pain gets worse. Do not begin these exercises until told by your health care provider. Stretching and range-of-motion exercises Cervical side bending 1. Using good posture, sit on a stable chair or stand up. 2. Without moving your shoulders, slowly tilt your left / right ear to your shoulder until you feel a stretch in the opposite side neck muscles. You should be looking straight ahead. 3. Hold for __________ seconds. 4. Repeat with the other side of your neck. Repeat __________ times. Complete this exercise __________ times a day.   Cervical rotation 1. Using good posture, sit on a stable chair or stand up. 2. Slowly turn your head to the side as if you are looking over your left / right shoulder. ? Keep your eyes level with the ground. ? Stop when you feel a stretch along the side and the back of your neck. 3. Hold for __________ seconds. 4. Repeat this by turning to your other side. Repeat __________ times. Complete this exercise __________ times a day.   Thoracic extension and pectoral stretch 1. Roll a towel or a small blanket so it is about 4 inches (10 cm) in diameter. 2. Lie down on your back on a firm surface. 3. Put the towel lengthwise, under your spine in the middle of your back. It should not be under your shoulder blades. The towel should line  up with your spine from your middle back to your lower back. 4. Put your hands behind your head and let your elbows fall out to your sides. 5. Hold for __________ seconds. Repeat __________ times. Complete this exercise __________ times a day. Strengthening exercises Isometric upper cervical flexion 1. Lie on your back with a thin pillow behind your head and a small rolled-up towel under your neck. 2. Gently tuck your chin toward your chest and nod your head down to look toward your feet. Do not lift your head off the pillow. 3. Hold for __________ seconds. 4. Release the tension slowly. Relax your neck muscles completely before you repeat this exercise. Repeat __________ times. Complete this exercise __________ times a day. Isometric cervical extension 1. Stand about 6 inches (15 cm) away from a wall, with your back facing the wall. 2. Place a soft object, about 6-8 inches (15-20 cm) in diameter, between the back of your head and the wall. A soft object could be a small pillow, a ball, or a folded towel. 3. Gently tilt your head back and press into the soft object. Keep your jaw and forehead relaxed. 4. Hold for __________ seconds. 5. Release the tension slowly. Relax your neck muscles completely before you repeat this exercise. Repeat __________ times. Complete this exercise __________ times a day.   Posture and body mechanics Body mechanics refers to the movements  and positions of your body while you do your daily activities. Posture is part of body mechanics. Good posture and healthy body mechanics can help to relieve stress in your body's tissues and joints. Good posture means that your spine is in its natural S-curve position (your spine is neutral), your shoulders are pulled back slightly, and your head is not tipped forward. The following are general guidelines for applying improved posture and body mechanics to your everyday activities. Sitting 1. When sitting, keep your spine neutral  and keep your feet flat on the floor. Use a footrest, if necessary, and keep your thighs parallel to the floor. Avoid rounding your shoulders, and avoid tilting your head forward. 2. When working at a desk or a computer, keep your desk at a height where your hands are slightly lower than your elbows. Slide your chair under your desk so you are close enough to maintain good posture. 3. When working at a computer, place your monitor at a height where you are looking straight ahead and you do not have to tilt your head forward or downward to look at the screen.   Standing  When standing, keep your spine neutral and keep your feet about hip-width apart. Keep a slight bend in your knees. Your ears, shoulders, and hips should line up.  When you do a task in which you stand in one place for a long time, place one foot up on a stable object that is 2-4 inches (5-10 cm) high, such as a footstool. This helps keep your spine neutral.   Resting When lying down and resting, avoid positions that are most painful for you. Try to support your neck in a neutral position. You can use a contour pillow or a small rolled-up towel. Your pillow should support your neck but not push on it. This information is not intended to replace advice given to you by your health care provider. Make sure you discuss any questions you have with your health care provider. Document Revised: 03/13/2019 Document Reviewed: 08/22/2018 Elsevier Patient Education  2021 [employer].

## 2021-01-26 ENCOUNTER — Other Ambulatory Visit (HOSPITAL_COMMUNITY): Payer: Self-pay | Admitting: Dermatology

## 2021-01-26 DIAGNOSIS — Z79899 Other long term (current) drug therapy: Secondary | ICD-10-CM | POA: Diagnosis not present

## 2021-01-26 DIAGNOSIS — L700 Acne vulgaris: Secondary | ICD-10-CM | POA: Diagnosis not present

## 2021-02-01 MED FILL — ETONOGESTREL-ETHINYL ESTRAD: 0.12-0.015 | 84 days supply | Qty: 3 | Fill #4

## 2021-02-01 MED FILL — MYORISAN 30 MG CAPSULE: 30 | 30 days supply | Qty: 60 | Fill #0

## 2021-02-17 ENCOUNTER — Other Ambulatory Visit (HOSPITAL_COMMUNITY): Payer: Self-pay | Admitting: Obstetrics and Gynecology

## 2021-02-17 DIAGNOSIS — Z01419 Encounter for gynecological examination (general) (routine) without abnormal findings: Secondary | ICD-10-CM | POA: Diagnosis not present

## 2021-02-17 DIAGNOSIS — Z3040 Encounter for surveillance of contraceptives, unspecified: Secondary | ICD-10-CM | POA: Diagnosis not present

## 2021-02-17 DIAGNOSIS — Z6821 Body mass index (BMI) 21.0-21.9, adult: Secondary | ICD-10-CM | POA: Diagnosis not present

## 2021-02-25 ENCOUNTER — Other Ambulatory Visit (HOSPITAL_COMMUNITY): Payer: Self-pay | Admitting: Dermatology

## 2021-02-25 DIAGNOSIS — L700 Acne vulgaris: Secondary | ICD-10-CM | POA: Diagnosis not present

## 2021-03-02 MED FILL — MYORISAN 30 MG CAPSULE: 30 | 30 days supply | Qty: 60 | Fill #0

## 2021-03-08 ENCOUNTER — Other Ambulatory Visit: Payer: Self-pay

## 2021-03-08 ENCOUNTER — Other Ambulatory Visit: Payer: Self-pay | Admitting: Oncology

## 2021-03-08 ENCOUNTER — Ambulatory Visit (HOSPITAL_COMMUNITY)
Admission: RE | Admit: 2021-03-08 | Discharge: 2021-03-08 | Disposition: A | Discharge status: Home / Self Care | Payer: 59 | Source: Ambulatory Visit | Attending: Oncology | Admitting: Oncology

## 2021-03-08 ENCOUNTER — Other Ambulatory Visit: Payer: Self-pay | Admitting: *Deleted

## 2021-03-08 DIAGNOSIS — R600 Localized edema: Secondary | ICD-10-CM | POA: Insufficient documentation

## 2021-03-08 DIAGNOSIS — M7989 Other specified soft tissue disorders: Secondary | ICD-10-CM

## 2021-03-08 NOTE — Progress Notes (Signed)
Jennifer Mcdowell has developed sudden swelling of the left lower extremity.  I am concerned she may have a DVT.  We are setting her up for Doppler ultrasonography

## 2021-03-08 NOTE — Progress Notes (Signed)
Lower extremity venous has been completed.   Preliminary results in CV Proc.   Blanch Media 03/08/2021 11:38 AM

## 2021-03-08 NOTE — Progress Notes (Signed)
LLL doppler ordered for today per Dr Darnelle Catalan. Pt having LLL edema. Pt to be held until doppler read and MD notified.

## 2021-03-10 ENCOUNTER — Encounter: Payer: Self-pay | Admitting: Family Medicine

## 2021-03-10 ENCOUNTER — Other Ambulatory Visit: Payer: Self-pay

## 2021-03-10 ENCOUNTER — Ambulatory Visit: Payer: 59 | Admitting: Family Medicine

## 2021-03-10 VITALS — BP 115/72 | HR 63 | Temp 98.1°F | Ht 64.0 in | Wt 122.0 lb

## 2021-03-10 DIAGNOSIS — R600 Localized edema: Secondary | ICD-10-CM

## 2021-03-10 DIAGNOSIS — R1032 Left lower quadrant pain: Secondary | ICD-10-CM

## 2021-03-10 NOTE — Patient Instructions (Signed)
We will order a pelvic US and they will call you to schedule.  I would like to rule out any pelvic congestion causes that may be preventing the normal venous  return in your left leg.   In the meantime, keeping your leg elevated when possible and wearing a compression stocking can help with symptoms.

## 2021-03-10 NOTE — Progress Notes (Signed)
This visit occurred during the SARS-CoV-2 public health emergency.  Safety protocols were in place, including screening questions prior to the visit, additional usage of staff PPE, and extensive cleaning of exam room while observing appropriate contact time as indicated for disinfecting solutions.    Jennifer Mcdowell , 1981-06-27, 40 y.o., female MRN: [id number] Patient Care Team    Relationship Specialty Notifications Start End  Natalia Leatherwood, DO PCP - General Family Medicine  03/16/18   Silverio Lay, MD Consulting Physician Obstetrics and Gynecology  03/16/18     Chief Complaint  Patient presents with  . Leg Swelling    Pt c/o intermittent leg swelling that worsen in the evening with tingling in toes denies any pain x 3 mos; pt is not fasting     Subjective: Pt presents for an OV with complaints of unilateral/left lower extremity swelling for the past 3 months.  She states the swelling is present every day.  It is worse in the evening, but does not always completely resolve by morning.  She states when the swelling is present she can sometimes have a tingling sensation in her large toe.  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  She reports by the end of the day she can push on her left lower extremity and now will be " dents."  She reports she sometimes sits with her ankles crossed, but not all the time.  She denies any fever, chills, cough, shortness of breath, or leg pain.  She does endorse an occasional abdominal discomfort that has occurred just 2-3 times over the course of the last few months in her left lower abdominal quadrant.  She states she will have a very quick sharp pain in that location.  She reports her bowel movements have not changed.  She denies any heavy lifting or injury.  She denies fever or chills. Another provider had ordered a left lower extremity venous duplex which ruled out DVT. She denies any injury or past surgeries  involving her left lower extremity.  She has no personal or family history of blood clots. Depression screen [facility] 2/9 01/06/2021 05/26/2020 05/15/2019 03/16/2018  Decreased Interest 0 0 0 0  Down, Depressed, Hopeless 0 0 0 0  PHQ - 2 Score 0 0 0 0    No Known Allergies Social History   Social History Narrative  . Not on file   Past Medical History:  Diagnosis Date  . Gestational diabetes    Past Surgical History:  Procedure Laterality Date  . NO PAST SURGERIES     Family History  Problem Relation Age of Onset  . Hypertension Father   . Breast cancer Maternal Grandmother 55       passed away at 55 in stage 4 at diagnosis.    Allergies as of 03/10/2021   No Known Allergies     Medication List       Accurate as of March 10, 2021 11:59 PM. If you have any questions, ask your nurse or doctor.        STOP taking these medications   clindamycin-benzoyl peroxide gel Commonly known as: BENZACLIN Stopped by: Felix Pacini, DO   cyclobenzaprine 5 MG tablet Commonly known as: FLEXERIL Stopped by: Felix Pacini, DO   naproxen 500 MG tablet Commonly known as: Naprosyn Stopped by: Felix Pacini, DO   spironolactone 100 MG tablet Commonly known as: ALDACTONE Stopped by: Felix Pacini, DO  TAKE these medications   etonogestrel-ethinyl estradiol 0.12-0.015 MG/24HR vaginal ring Commonly known as: NUVARING Place vaginally.   iron polysaccharides 150 MG capsule Commonly known as: NIFEREX Take 1 capsule (150 mg total) by mouth every other day.   MULTIVITAMIN ADULT PO Take by mouth.   Myorisan 30 MG capsule Generic drug: ISOtretinoin Take 60 mg by mouth daily.   triamcinolone ointment 0.1 % Commonly known as: KENALOG Apply topically.       All past medical history, surgical history, allergies, family history, immunizations andmedications were updated in the EMR today and reviewed under the history and medication portions of their EMR.     ROS: Negative, with the  exception of above mentioned in HPI   Objective:  BP 115/72   Pulse 63   Temp 98.1 F (36.7 C) (Oral)   Ht 5\' 4"  (1.626 m)   Wt 122 lb (55.3 kg)   SpO2 98%   BMI 20.94 kg/m  Body mass index is 20.94 kg/m. Gen: Afebrile. No acute distress. Nontoxic in appearance, well developed, well nourished.  HENT: AT. [place].  Eyes:Pupils Equal Round Reactive to light, Extraocular movements intact,  Conjunctiva without redness, discharge or icterus. CV: RRR no murmur, left lower extremity with trace edema.  Right lower extremity no edema. Chest: CTAB, no wheeze or crackles. Good air movement, normal resp effort.  Abd: Soft.  Flat.  Mild TTP left lower quadrant just medially to ASIS.  Mild tissue texture change at this location no discrete mass palpated.  ND. BS present.  No rebound or guarding.   Lower extremity: No edema right lower extremity.  Mild/trace edema left lower extremity without any erythema.  Negative Homans.  No inguinal lymphadenopathy palpated bilaterally.  Pulses equally bilateral femoral and posterior tibialis.  Neurovascularly intact distally. Skin: No rashes, purpura or petechiae.  Neuro: Normal gait. PERLA. EOMi. Alert. Oriented x3  Psych: Normal affect, dress and demeanor. Normal speech. Normal thought content and judgment.  No exam data present No results found. No results found for this or any previous visit (from the past 24 hour(s)).  Assessment/Plan: Jennifer Mcdowell is a 40 y.o. female present for OV for  Left lower quadrant abdominal pain/left leg edema Patient presents with unilateral leg swelling of 3 months duration.  DVT had been ruled out by venous ultrasound prior to her appointment today by another provider. There is no injury or prior surgical potential cause for her unilateral leg edema. We discussed possible causes such as venous return obstruction, pelvic congestion of unknown cause.  She endorses sharp fleeting left lower quadrant pain that only  last a few seconds, twice, over the last 3 months.  No family history of lymphoma and she denies any recent infections or lymphadenopathy. We discussed obtaining imaging of abdomen/pelvis to rule out possible for pelvic congestion and further evaluate the left lower quadrant discomfort. We discussed possible obstruction of venous return causing left lower extremity edema. Given unilateral presentation, doubt edema is secondary to a cardiac, liver or renal cause. - CT Abdomen Pelvis W Contrast; Future Temporarily for comfort, she can consider compression stocking while we move forward with imaging. Follow-up dependent upon image studies.    Reviewed expectations re: course of current medical issues.  Discussed self-management of symptoms.  Outlined signs and symptoms indicating need for more acute intervention.  Patient verbalized understanding and all questions were answered.  Patient received an After-Visit Summary.    Orders Placed This Encounter  Procedures  . CT Abdomen Pelvis  W Contrast   No orders of the defined types were placed in this encounter.  Referral Orders  No referral(s) requested today     Note is dictated utilizing voice recognition software. Although note has been proof read prior to signing, occasional typographical errors still can be missed. If any questions arise, please do not hesitate to call for verification.   electronically signed by:  Felix Pacini, DO  [facility] [place]

## 2021-03-11 ENCOUNTER — Other Ambulatory Visit: Payer: Self-pay | Admitting: *Deleted

## 2021-03-11 ENCOUNTER — Encounter: Payer: Self-pay | Admitting: Family Medicine

## 2021-03-11 ENCOUNTER — Other Ambulatory Visit: Payer: Self-pay | Admitting: Oncology

## 2021-03-11 ENCOUNTER — Ambulatory Visit (HOSPITAL_COMMUNITY)
Admission: RE | Admit: 2021-03-11 | Discharge: 2021-03-11 | Disposition: A | Discharge status: Home / Self Care | Payer: 59 | Source: Ambulatory Visit | Attending: Oncology | Admitting: Oncology

## 2021-03-11 ENCOUNTER — Inpatient Hospital Stay: Payer: 59 | Attending: Family Medicine

## 2021-03-11 ENCOUNTER — Encounter: Payer: Self-pay | Admitting: Oncology

## 2021-03-11 DIAGNOSIS — K760 Fatty (change of) liver, not elsewhere classified: Secondary | ICD-10-CM | POA: Diagnosis not present

## 2021-03-11 DIAGNOSIS — R140 Abdominal distension (gaseous): Secondary | ICD-10-CM

## 2021-03-11 DIAGNOSIS — M7989 Other specified soft tissue disorders: Secondary | ICD-10-CM

## 2021-03-11 DIAGNOSIS — Z793 Long term (current) use of hormonal contraceptives: Secondary | ICD-10-CM

## 2021-03-11 LAB — CBC WITH DIFFERENTIAL/PLATELET
Abs Immature Granulocytes: 0.01 10*3/uL (ref 0.00–0.07)
Basophils Absolute: 0 10*3/uL (ref 0.0–0.1)
Basophils Relative: 0 %
Eosinophils Absolute: 0 10*3/uL (ref 0.0–0.5)
Eosinophils Relative: 1 %
HCT: 38.2 % (ref 36.0–46.0)
Hemoglobin: 12.5 g/dL (ref 12.0–15.0)
Immature Granulocytes: 0 %
Lymphocytes Relative: 35 %
Lymphs Abs: 2 10*3/uL (ref 0.7–4.0)
MCH: 26 pg (ref 26.0–34.0)
MCHC: 32.7 g/dL (ref 30.0–36.0)
MCV: 79.6 fL — ABNORMAL LOW (ref 80.0–100.0)
Monocytes Absolute: 0.5 10*3/uL (ref 0.1–1.0)
Monocytes Relative: 9 %
Neutro Abs: 3.1 10*3/uL (ref 1.7–7.7)
Neutrophils Relative %: 55 %
Platelets: 361 10*3/uL (ref 150–400)
RBC: 4.8 MIL/uL (ref 3.87–5.11)
RDW: 13.2 % (ref 11.5–15.5)
WBC: 5.7 10*3/uL (ref 4.0–10.5)
nRBC: 0 % (ref 0.0–0.2)

## 2021-03-11 LAB — COMPREHENSIVE METABOLIC PANEL
ALT: 18 U/L (ref 0–44)
AST: 28 U/L (ref 15–41)
Albumin: 3.6 g/dL (ref 3.5–5.0)
Alkaline Phosphatase: 51 U/L (ref 38–126)
Anion gap: 12 (ref 5–15)
BUN: 6 mg/dL (ref 6–20)
CO2: 23 mmol/L (ref 22–32)
Calcium: 8.5 mg/dL — ABNORMAL LOW (ref 8.9–10.3)
Chloride: 104 mmol/L (ref 98–111)
Creatinine, Ser: 0.69 mg/dL (ref 0.44–1.00)
GFR, Estimated: >60 mL/min (ref >60)
Glucose, Bld: 74 mg/dL (ref 70–99)
Potassium: 3.7 mmol/L (ref 3.5–5.1)
Sodium: 139 mmol/L (ref 135–145)
Total Bilirubin: 0.5 mg/dL (ref 0.3–1.2)
Total Protein: 7.2 g/dL (ref 6.5–8.1)

## 2021-03-11 LAB — TSH: TSH: 3.681 u[IU]/mL (ref 0.308–3.960)

## 2021-03-11 MED ORDER — IOHEXOL 300 MG/ML  SOLN
100.0000 mL | Freq: Once | INTRAMUSCULAR | Status: AC | PRN
  Administered 2021-03-11: 100 mL via INTRAVENOUS

## 2021-03-11 NOTE — Progress Notes (Signed)
I called the Jennifer Mcdowell and gave her the results of her lab work and also her CT scans.  The lab work is unremarkable.  The CT scan does not explain why she would have unilateral lower extremity splints swelling, which continues to concern me.  The scans do show significant hepatic steatosis.  I will alert her primary care physician regarding this.  At this point I am not planning any further follow-up but will leave it up to Jennifer Mcdowell to let me know if the leg swelling does not resolve or if fever or erythema develops.

## 2021-03-11 NOTE — Progress Notes (Signed)
, °

## 2021-03-11 NOTE — Progress Notes (Signed)
Ms.Depass's Doppler study was negative.  That is encouraging.  However the left lower extremity is actually now more swollen and she is experiencing abdominal bloating and discomfort. I am concerned that we may be dealing with concealed adenopathy or other problems more proximal.  I am going to set her up for some baseline lab as well as a CT scan of the abdomen and pelvis.  Discussed with the patient.

## 2021-03-12 ENCOUNTER — Ambulatory Visit (HOSPITAL_COMMUNITY): Payer: 59

## 2021-03-30 ENCOUNTER — Other Ambulatory Visit (HOSPITAL_COMMUNITY): Payer: Self-pay

## 2021-03-30 DIAGNOSIS — L700 Acne vulgaris: Secondary | ICD-10-CM | POA: Diagnosis not present

## 2021-03-30 MED ORDER — TRETINOIN 0.025 % EX CREA
TOPICAL_CREAM | CUTANEOUS | 3 refills | Status: DC
  Filled 2021-03-30 – 2021-04-20 (×2): qty 45, 30d supply, fill #0

## 2021-04-20 ENCOUNTER — Other Ambulatory Visit (HOSPITAL_COMMUNITY): Payer: Self-pay

## 2021-04-20 MED FILL — Polysaccharide Iron Complex Cap 150 MG (Iron Equivalent): ORAL | 90 days supply | Qty: 45 | Fill #0 | Status: AC

## 2021-04-20 MED FILL — Etonogestrel-Ethinyl Estradiol VA Ring 0.12-0.015 MG/24HR: VAGINAL | 84 days supply | Qty: 3 | Fill #0 | Status: AC

## 2021-04-21 ENCOUNTER — Ambulatory Visit: Payer: 59 | Admitting: Family Medicine

## 2021-04-21 ENCOUNTER — Encounter: Payer: Self-pay | Admitting: Family Medicine

## 2021-04-21 ENCOUNTER — Other Ambulatory Visit: Payer: Self-pay

## 2021-04-21 ENCOUNTER — Other Ambulatory Visit (HOSPITAL_COMMUNITY): Payer: Self-pay

## 2021-04-21 VITALS — BP 118/73 | HR 74 | Temp 98.3°F | Ht 64.0 in | Wt 123.0 lb

## 2021-04-21 DIAGNOSIS — I890 Lymphedema, not elsewhere classified: Secondary | ICD-10-CM | POA: Diagnosis not present

## 2021-04-21 LAB — C-REACTIVE PROTEIN: CRP: <1 mg/dL (ref 0.5–20.0)

## 2021-04-21 LAB — SEDIMENTATION RATE: Sed Rate: 13 mm/hr (ref 0–20)

## 2021-04-21 NOTE — Patient Instructions (Signed)
Lymphatic Filariasis  Lymphatic filariasis is an infection caused by a thread-like worm (filarial parasite) that spreads through mosquito bites. If a mosquito that is infected with the parasite bites a person, the parasite can pass from the mosquito into the person's body. When it has entered the body, the parasite lives in the network of lymph vessels and lymph nodes that runs throughout the body (lymphatic system). The lymphatic system helps balance fluid levels in the body and helps fight infections. What are the causes? This condition is caused by a bite from an infected mosquito. It takes many mosquito bites over a long time for a person to become infected with these parasites. When you get bitten, immature, microscopic worms (microfilariae) can pass through the skin and into the lymphatic system. Microfilariae grow into adults and release millions of microfilariae that travel through the blood. Filarial worms can live in the lymphatic system for years. If a mosquito gets infected by biting an infected person, the mosquito can pass the infection to other people. What increases the risk? You are more likely to develop this condition if you live in:  [place].  [place].  [place].  [place].  The [place]. Visiting these areas is not likely to put you at risk. What are the signs or symptoms? Symptoms of this condition may not show for many years after you have been infected with the filarial worm. This condition damages the lymphatic system over time. As this happens, you may develop a type of swelling (lymphedema) or bacterial skin infections that thicken the skin. The combination of lymphedema and thickened skin results in a condition called elephantiasis. Severe cases of elephantiasis can be disfiguring and uncomfortable. Elephantiasis may affect the legs, arms, breasts, and genital areas. Some people develop an allergic reaction to lymphatic filariasis (pulmonary eosinophilia  syndrome). This may cause:  Coughing.  Wheezing. This is when you make whistling sounds when you breathe.  Trouble breathing. In some cases, there are no symptoms of lymphatic filariasis. How is this diagnosed? This condition may be diagnosed based on:  Your symptoms.  Your medical history.  Whether you have lived in an area where mosquitoes spread the disease.  A physical exam. This may include a blood test to confirm the diagnosis. Your health care provider may: ? Check a sample of your blood under a microscope to look for microfilariae. ? Test a sample of your blood for antibodies against lymphatic filariasis. Antibodies are proteins that your body makes to protect you from germs and other things that can make you sick. How is this treated? This condition may be treated with medicines, including:  Citrate salt (diethylcarbamazine). This medicine is used to kill the microscopic worms in your blood. It also kills the adult worms, but it does not kill all of them.  Antibiotic medicine to help fight any bacterial infections that you may have.  Steroids to help reduce inflammation and swelling. There is no cure for lymphedema when it occurs, but treatment can help to lessen the effects of the infection. Follow these instructions at home: Medicines  Take over-the-counter and prescription medicines only as told by your health care provider.  If you were prescribed an antibiotic medicine, take it as told by your health care provider. Do not stop taking the antibiotic even if you start to feel better. This is important. General instructions  Raise (elevate) swollen limbs above the level of your heart while you are sitting or lying down.  Carefully wash any  swollen areas every day with soap and water.  Exercise swollen limbs as told by your health care provider.  Treat cuts or scrapes with antiseptic cream or ointment to prevent infection.  Avoid using hot tubs. They can make  the swelling from lymphedema worse.  Keep all follow-up visits as told by your health care provider. This is important. How is this prevented? If you live in an area where mosquitoes carry lymphatic filariasis:  Sleep under a mosquito net.  Avoid being outside from dusk to dawn. This is when mosquitoes are very active.  Wear long sleeves and pants.  Use mosquito repellent. Where to find more information  Centers for Disease Control and Prevention: http://www.martinez-harris.com/ Contact a health care provider if:  Your symptoms get worse or do not get better.  You develop a red, swollen, or painful area on your skin.  You develop a cough.  You start to wheeze. Get help right away if:  You have a fever and redness on your skin that is spreading.  You develop severe shortness of breath. These symptoms may represent a serious problem that is an emergency. Do not wait to see if the symptoms will go away. Get medical help right away. Call your local emergency services (911 in the [place] Do not drive yourself to the hospital. Summary  Lymphatic filariasis is an infection caused by a thread-like worm (filarial parasite) that spreads through mosquito bites.  This condition may be treated with medicines that kill the parasite, fight infections, and reduce swelling.  Take over-the-counter and prescription medicines only as told by your health care provider.  To prevent lymphatic filariasis, take actions to prevent mosquito bites. This includes wearing long sleeves and pants and using mosquito repellent. This information is not intended to replace advice given to you by your health care provider. Make sure you discuss any questions you have with your health care provider. Document Revised: 10/21/2019 Document Reviewed: 10/21/2019 Elsevier Patient Education  2021 [employer].

## 2021-04-21 NOTE — Progress Notes (Signed)
This visit occurred during the SARS-CoV-2 public health emergency.  Safety protocols were in place, including screening questions prior to the visit, additional usage of staff PPE, and extensive cleaning of exam room while observing appropriate contact time as indicated for disinfecting solutions.    Jennifer Mcdowell , 02/06/1981, 40 y.o., female MRN: [id number] Patient Care Team    Relationship Specialty Notifications Start End  Ma Hillock, DO PCP - General Family Medicine  03/16/18   Delsa Bern, MD Consulting Physician Obstetrics and Gynecology  03/16/18     Chief Complaint  Patient presents with  . Leg Swelling    L leg  . Follow-up     Subjective: Jennifer Mcdowell is 40 y.o. female Pt presents for an OV to follow-up on her left lower extremity lymphedema, now has been present since approximately January 2022.  She has had a normal vascular ultrasound study of her lower extremities.  CT abdomen pelvis was completed to rule out pelvic congestion, pelvic etiology with her reports of occasional sharp pain in the left lower abdomen, or blockage to lymph drainage from the pelvic/inguinal region-this was also normal.  She has had a normal CMP, with only a mild lower and calcium at 8.5.  CBC normal.  TSH normal. She returns today to discuss her lymphedema.  She has been wearing compression stockings at times, and she has been keeping her feet uncrossed at work and keeping her leg elevated when possible.  The lymphedema seems to continue and is causing her discomfort especially with her shoes and around her ankle.   Prior note: With complaints of unilateral/left lower extremity swelling for the past 3 months.  She states the swelling is present every day.  It is worse in the evening, but does not always completely resolve by morning.  She states when the swelling is present she can sometimes have a tingling sensation in her large toe.  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  She reports by the end of the day she can push on her left lower extremity and now will be " dents."  She reports she sometimes sits with her ankles crossed, but not all the time.  She denies any fever, chills, cough, shortness of breath, or leg pain.  She does endorse an occasional abdominal discomfort that has occurred just 2-3 times over the course of the last few months in her left lower abdominal quadrant.  She states she will have a very quick sharp pain in that location.  She reports her bowel movements have not changed.  She denies any heavy lifting or injury.  She denies fever or chills. Another provider had ordered a left lower extremity venous duplex which ruled out DVT. She denies any injury or past surgeries involving her left lower extremity.  She has no personal or family history of blood clots.  Vascular ultrasound 03/08/2021: RIGHT:  - No evidence of common femoral vein obstruction.  LEFT:  - There is no evidence of deep vein thrombosis in the lower extremity.  - No cystic structure found in the popliteal fossa.  CT abdomen pelvis 03/11/2021: IMPRESSION: 1. Hepatomegaly and hepatic steatosis. 2. Otherwise no acute intra-abdominal or intrapelvic abnormality.  Depression screen [facility] 2/9 01/06/2021 05/26/2020 05/15/2019 03/16/2018  Decreased Interest 0 0 0 0  Down, Depressed, Hopeless 0 0 0 0  PHQ - 2 Score 0 0 0 0    No Known  Allergies Social History   Social History Narrative  . Not on file   Past Medical History:  Diagnosis Date  . Gestational diabetes    Past Surgical History:  Procedure Laterality Date  . NO PAST SURGERIES     Family History  Problem Relation Age of Onset  . Hypertension Father   . Breast cancer Maternal Grandmother 87       passed away at 54 in stage 4 at diagnosis.    Allergies as of 04/21/2021   No Known Allergies     Medication List       Accurate as of Apr 21, 2021 11:59 PM. If you have  any questions, ask your nurse or doctor.        etonogestrel-ethinyl estradiol 0.12-0.015 MG/24HR vaginal ring Commonly known as: NUVARING INSERT 1 RING VAGINALLY ONCE EVERY 4 WEEKS; LEAVE IN FOR 3 WEEKS AND REMOVE FOR 1 WEEK. OR AS DIRECTED BY MD   etonogestrel-ethinyl estradiol 0.12-0.015 MG/24HR vaginal ring Commonly known as: [place] vaginally.   etonogestrel-ethinyl estradiol 0.12-0.015 MG/24HR vaginal ring Commonly known as: NUVARING INSERT 1 RING VAGINALLY ONCE A MONTH   iron polysaccharides 150 MG capsule Commonly known as: NIFEREX Take 1 capsule (150 mg total) by mouth every other day.   Ferrex 150 150 MG capsule Generic drug: iron polysaccharides TAKE 1 CAPSULE BY MOUTH EVERY OTHER DAY   MULTIVITAMIN ADULT PO Take by mouth.   Myorisan 30 MG capsule Generic drug: ISOtretinoin Take 60 mg by mouth daily. What changed: Another medication with the same name was removed. Continue taking this medication, and follow the directions you see here. Changed by: Howard Pouch, DO   tretinoin 0.025 % cream Commonly known as: RETIN-A Apply a dime-size amount onto the entire face as tolerated at bedtime (Apply a dime-size amount onto the entire face as tolerated at bedtime)   triamcinolone ointment 0.1 % Commonly known as: KENALOG Apply topically.   triamcinolone ointment 0.1 % Commonly known as: KENALOG APPLY TOPICALLY ONTO THE SKIN 2 TIMES DAILY TAPER USE AS ABLE       All past medical history, surgical history, allergies, family history, immunizations andmedications were updated in the EMR today and reviewed under the history and medication portions of their EMR.     ROS: Negative, with the exception of above mentioned in HPI   Objective:  BP 118/73   Pulse 74   Temp 98.3 F (36.8 C) (Oral)   Ht 5' 4" (1.626 m)   Wt 123 lb (55.8 kg)   SpO2 100%   BMI 21.11 kg/m  Body mass index is 21.11 kg/m. Gen: Afebrile. No acute distress.  HENT: AT. Jennifer Mcdowell.   Eyes:Pupils Equal Round Reactive to light, Extraocular movements intact,  Conjunctiva without redness, discharge or icterus. Neck/lymp/endocrine: Supple,no lymphadenopathy CV: RRR no murmur, +1 edema left foot and trace edema left lower extremity, +2/4 P posterior tibialis pulses Chest: CTAB, no wheeze or crackles Skin: No rashes, purpura or petechiae.  Skin intact. Neuro: Normal gait. PERLA. EOMi. Alert. Oriented x3 Psych: Normal affect, dress and demeanor. Normal speech. Normal thought   No exam data present No results found. No results found for this or any previous visit (from the past 24 hour(s)).  Assessment/Plan: Myesha Stillion is a 40 y.o. female present for OV for  Lymphedema: Work-up thus far is unrevealing on possible cause with normal vascular ultrasound, CBC, CMP, TSH and CT abdomen pelvis.  There has been no injury or prior surgical procedure to  explain lymphedema. Continue compression stockings for now. We discussed the unusual presentation and less common causes of unilateral lymphedema.   -We will repeat CBC today with pathology smear review. -IgE - ANA with IFA comprehensive panel -ESR and CRP -RF and CCP -Angiotensin-converting enzyme - UA with reflex microscopic -We discussed filariasis as a potential cause, she is from [place] and visits frequently.  I do not believe we have the specialized tests to test for this today.  We will collect an IgE and CBC with path review.  Discussed attempting to treat with doxycycline to see if any benefit. We discussed if work-up is normal next step could be to refer to sports medicine with OMT-eval for could consider lymphedema clinic.  Once all results received patient will be called in follow-up plan will be discussed.    Reviewed expectations re: course of current medical issues.  Discussed self-management of symptoms.  Outlined signs and symptoms indicating need for more acute intervention.  Patient  verbalized understanding and all questions were answered.  Patient received an After-Visit Summary.    Orders Placed This Encounter  Procedures  . TIQ-MISC  . CBC w/Diff  . Pathologist smear review  . IgE  . ANA, IFA Comprehensive Panel-([facility])  . Sedimentation rate  . C-reactive protein  . Rheumatoid Factor  . Cyclic citrul peptide antibody, IgG ([facility])  . Angiotensin converting enzyme  . Urinalysis, Routine w reflex microscopic   No orders of the defined types were placed in this encounter.  Referral Orders  No referral(s) requested today     Note is dictated utilizing voice recognition software. Although note has been proof read prior to signing, occasional typographical errors still can be missed. If any questions arise, please do not hesitate to call for verification.   electronically signed by:  Howard Pouch, DO  [place]

## 2021-04-22 LAB — TIQ-MISC

## 2021-04-23 ENCOUNTER — Other Ambulatory Visit (HOSPITAL_COMMUNITY): Payer: Self-pay

## 2021-04-23 ENCOUNTER — Telehealth: Payer: Self-pay | Admitting: Family Medicine

## 2021-04-23 LAB — CBC WITH DIFFERENTIAL/PLATELET
Absolute Monocytes: 354 cells/uL (ref 200–950)
Basophils Absolute: 12 cells/uL (ref 0–200)
Basophils Relative: 0.2 %
Eosinophils Absolute: 102 cells/uL (ref 15–500)
Eosinophils Relative: 1.7 %
HCT: 39.7 % (ref 35.0–45.0)
Hemoglobin: 13.2 g/dL (ref 11.7–15.5)
Lymphs Abs: 2106 cells/uL (ref 850–3900)
MCH: 26.6 pg — ABNORMAL LOW (ref 27.0–33.0)
MCHC: 33.2 g/dL (ref 32.0–36.0)
MCV: 80 fL (ref 80.0–100.0)
MPV: 10.1 fL (ref 7.5–12.5)
Monocytes Relative: 5.9 %
Neutro Abs: 3426 cells/uL (ref 1500–7800)
Neutrophils Relative %: 57.1 %
Platelets: 370 10*3/uL (ref 140–400)
RBC: 4.96 10*6/uL (ref 3.80–5.10)
RDW: 12.4 % (ref 11.0–15.0)
Total Lymphocyte: 35.1 %
WBC: 6 10*3/uL (ref 3.8–10.8)

## 2021-04-23 LAB — ANA, IFA COMPREHENSIVE PANEL
Anti Nuclear Antibody (ANA): NEGATIVE
ENA SM Ab Ser-aCnc: <1 AI
SM/RNP: <1 AI
SSA (Ro) (ENA) Antibody, IgG: <1 AI
SSB (La) (ENA) Antibody, IgG: <1 AI
Scleroderma (Scl-70) (ENA) Antibody, IgG: <1 AI
ds DNA Ab: <1 IU/mL

## 2021-04-23 LAB — CYCLIC CITRUL PEPTIDE ANTIBODY, IGG: Cyclic Citrullin Peptide Ab: <16 UNITS

## 2021-04-23 LAB — PATHOLOGIST SMEAR REVIEW

## 2021-04-23 LAB — RHEUMATOID FACTOR: Rheumatoid fact SerPl-aCnc: <14 IU/mL (ref <14)

## 2021-04-23 LAB — IGE: IgE (Immunoglobulin E), Serum: 102 kU/L (ref <=114)

## 2021-04-23 LAB — ANGIOTENSIN CONVERTING ENZYME: Angiotensin-Converting Enzyme: 22 U/L (ref 9–67)

## 2021-04-23 MED ORDER — DOXYCYCLINE HYCLATE 100 MG PO TABS
100.0000 mg | ORAL_TABLET | Freq: Two times a day (BID) | ORAL | 1 refills | Status: DC
  Filled 2021-04-23: qty 60, 30d supply, fill #0

## 2021-04-23 NOTE — Telephone Encounter (Addendum)
Please call patient and ask her the following: Given that her husband is a oncologist, they may have a particular lymphedema specialist they are desiring a referral to.  If so, if they provide me that person's name/specialty we can refer her specifically to that person.   I believe the [facility] lymphedema clinic only sees cancer patients, but I could be mistaken. [facility] has a lymphedema clinic, that will treat all causes of lymphedema.  We could refer there if it is okay with them and she does not already have somebody else in mind.

## 2021-04-23 NOTE — Telephone Encounter (Signed)
Spoke with pt regarding labs and instructions.   

## 2021-04-23 NOTE — Telephone Encounter (Signed)
Please call patient: -Inflammatory markers are negative - Blood cell counts are normal and the pathological smear review showed what appeared to be iron deficiency which she was already aware of. -Autoimmune panel was negative.  As well as rheumatoid factor and CCP. -Test that look for sarcoid is negative. - Her IgE is also normal> can be elevated with  parasitic infections. -I do not see a urinalysis was collected.  This particular test was to look for any blood cells and protein in the urine which could be associated with kidney causes.    I have called in the doxycycline twice daily for use 4-6 weeks to attempt to see if she gets any benefit with use. If no decrease in swelling is appreciated after 3 weeks, she can discontinue as it likely would not be helpful for her.  It is extremely important that she understand if she starts the doxycycline she will need to discontinue all retinoic acid medications.  So she would have to discontinue the [place] and the Retin-A cream while taking the doxycycline.  Medicines cannot be taken together, they have a potential to cause pseudotumor cerebri with use.  Lastly, we could also consider referring her to a lymphedema clinic/physical therapist.  Please advise of her decision.

## 2021-04-26 ENCOUNTER — Other Ambulatory Visit: Payer: 59

## 2021-04-26 ENCOUNTER — Other Ambulatory Visit: Payer: Self-pay

## 2021-04-26 ENCOUNTER — Ambulatory Visit (INDEPENDENT_AMBULATORY_CARE_PROVIDER_SITE_OTHER): Payer: 59

## 2021-04-26 DIAGNOSIS — R8290 Unspecified abnormal findings in urine: Secondary | ICD-10-CM | POA: Diagnosis not present

## 2021-04-26 DIAGNOSIS — Z0000 Encounter for general adult medical examination without abnormal findings: Secondary | ICD-10-CM | POA: Diagnosis not present

## 2021-04-26 LAB — URINALYSIS, ROUTINE W REFLEX MICROSCOPIC
Bilirubin Urine: NEGATIVE
Hgb urine dipstick: NEGATIVE
Ketones, ur: NEGATIVE
Nitrite: NEGATIVE
Specific Gravity, Urine: <=1.005 — AB (ref 1.000–1.030)
Total Protein, Urine: NEGATIVE
Urine Glucose: NEGATIVE
Urobilinogen, UA: 0.2 (ref 0.0–1.0)
pH: 7 (ref 5.0–8.0)

## 2021-04-27 LAB — URINE CULTURE
MICRO NUMBER:: 11922292
Result:: NO GROWTH
SPECIMEN QUALITY:: ADEQUATE

## 2021-04-27 NOTE — Telephone Encounter (Signed)
Spoke with pt and will talk to husband and cb with preferred office

## 2021-04-28 ENCOUNTER — Encounter: Payer: Self-pay | Admitting: Family Medicine

## 2021-04-28 DIAGNOSIS — I890 Lymphedema, not elsewhere classified: Secondary | ICD-10-CM

## 2021-05-04 DIAGNOSIS — L700 Acne vulgaris: Secondary | ICD-10-CM | POA: Diagnosis not present

## 2021-05-04 DIAGNOSIS — Z79899 Other long term (current) drug therapy: Secondary | ICD-10-CM | POA: Diagnosis not present

## 2021-05-05 ENCOUNTER — Encounter: Payer: Self-pay | Admitting: Physical Therapy

## 2021-05-05 ENCOUNTER — Other Ambulatory Visit: Payer: Self-pay

## 2021-05-05 ENCOUNTER — Ambulatory Visit: Payer: 59 | Attending: Family Medicine | Admitting: Physical Therapy

## 2021-05-05 DIAGNOSIS — I890 Lymphedema, not elsewhere classified: Secondary | ICD-10-CM | POA: Diagnosis not present

## 2021-05-05 NOTE — Therapy (Signed)
[facility] [address] [place], [place], [address] Phone: [phone]   Fax:  [phone]  Physical Therapy Treatment  Patient Details  Name: Jennifer Mcdowell MRN: [id number] Date of Birth: [birth year] Referring Provider (PT): Felix Pacini   Encounter Date: 05/05/2021   PT End of Session - 05/05/21 0845    Visit Number 1    Number of Visits 13    Date for PT Re-Evaluation 06/02/21    PT Start Time 0806    PT Stop Time 0850    PT Time Calculation (min) 44 min    Activity Tolerance Patient tolerated treatment well    Behavior During Therapy [facility] for tasks assessed/performed           Past Medical History:  Diagnosis Date  . Gestational diabetes     Past Surgical History:  Procedure Laterality Date  . NO PAST SURGERIES      There were no vitals filed for this visit.   Subjective Assessment - 05/05/21 0809    Subjective The swelling started four months ago. I thought it would go away. I traveled during Thanksgiving to [place] which was a pretty long flight. It does not go past the knee.    Pertinent History anemia    Patient Stated Goals to get the swelling to go away    Currently in Pain? No/denies              [facility] PT Assessment - 05/05/21 0001      Assessment   Medical Diagnosis LLE lymphedema    Referring Provider (PT) Felix Pacini    Onset Date/Surgical Date 12/05/20    Hand Dominance Right    Prior Therapy none      Precautions   Precautions None      Restrictions   Weight Bearing Restrictions No      Balance Screen   Has the patient fallen in the past 6 months No    Has the patient had a decrease in activity level because of a fear of falling?  No    Is the patient reluctant to leave their home because of a fear of falling?  No      Home Nurse, mental health Private residence    Living Arrangements Spouse/significant other;Children    Available Help at Discharge  Family    Type of Home House      Prior Function   Level of Independence Independent    Vocation Part time employment    Leisure pt walks daily for at least a mile      Cognition   Overall Cognitive Status Within Functional Limits for tasks assessed      Observation/Other Assessments   Observations visible swelling at L foot and lower leg - pt has copper compression sock on             LYMPHEDEMA/ONCOLOGY QUESTIONNAIRE - 05/05/21 0001      Type   Cancer Type LLE lymphedema      Lymphedema Assessments   Lymphedema Assessments Lower extremities      Right Lower Extremity Lymphedema   10 cm Proximal to Suprapatella 39.8 cm    At Midpatella/Popliteal Crease 34 cm    30 cm Proximal to Floor at Lateral Plantar Foot 32 cm    20 cm Proximal to Floor at Lateral Plantar Foot 22.6 1    10  cm Proximal to Floor at Lateral Malleoli 18.5 cm    5 cm Proximal  to 1st MTP Joint 19.5 cm    Across MTP Joint 21 cm    Around Proximal Great Toe 7.2 cm      Left Lower Extremity Lymphedema   10 cm Proximal to Suprapatella 40 cm    At Midpatella/Popliteal Crease 35 cm    30 cm Proximal to Floor at Lateral Plantar Foot 32.6 cm    20 cm Proximal to Floor at Lateral Plantar Foot 23 cm    10 cm Proximal to Floor at Lateral Malleoli 21.4 cm    5 cm Proximal to 1st MTP Joint 20 cm    Across MTP Joint 20.5 cm    Around Proximal Great Toe 7.2 cm                      [facility] Adult PT Treatment/Exercise - 05/05/21 0001      Manual Therapy   Manual Therapy Edema management    Edema Management measured pt for an exostrong below knee compression garment and issued info to pt on how to obtain                  PT Education - 05/05/21 0857    Education Details anatomy and physiology of lymphatic system, possible causes of swelling, how to manage swelling with compression garments, different types of compression garments and how to obtain    Person(s) Educated Patient    Methods  Explanation    Comprehension Verbalized understanding               PT Long Term Goals - 05/05/21 0854      PT LONG TERM GOAL #1   Title Pt will obtain appropriate compression garment for long term management of lymphedema.    Time 4    Period Weeks    Status New    Target Date 06/02/21      PT LONG TERM GOAL #2   Title Pt will demonstrate a 1 cm decrease in edema 10 cm proximal to floor at lateral plantar foot to allow improved comfort    Baseline 21.4    Time 4    Period Weeks    Status New    Target Date 06/02/21      PT LONG TERM GOAL #3   Title Pt will be able to indepently manage her lymphedema thorugh self MLD and compression garments    Time 4    Period Weeks    Status New    Target Date 06/02/21                 Plan - 05/05/21 0858    Clinical Impression Statement Pt reports to PT with LLE lymphedema from the knee down. She reports it began about four months ago. Pt did fly internationally around Thanksgiving and reports that usually when she flies she does noticie swelling in bilateral LEs from the knee down but most pronouned at ankle and foot. She reports it could have started around this time and it just never went down like the RLE. Pt has had many different tests to determine the cause of the swelling but everything has been normal. Discussed the fact that it could be primary lymphedema and just started to show up or it could be some venous insuffiency though this usually is bialteral. Educated pt that she will most likely need to wear compression going forward especially if she is active or flying. Measured pt today and gave her info on how to  obtain a flat knit knee high compression garment for the LLE. Will try bandaging to see if she has any reduction. Even if she does have reduction the exo strong size small will still fit. Pt would benefit from skilled PT services to decrease LLE edema, assist pt with obtaining appropriate compression garments and  instruct pt in how to manage lymphedema independently.    Stability/Clinical Decision Making Stable/Uncomplicated    Clinical Decision Making Low    Rehab Potential Excellent    PT Frequency 3x / week    PT Duration 4 weeks    PT Treatment/Interventions ADLs/Self Care Home Management;Therapeutic exercise;Therapeutic activities;Patient/family education;Manual techniques;Manual lymph drainage;Compression bandaging;Taping;Vasopneumatic Device    PT Next Visit Plan MLD to LLE and instruct pt, also instruct pt in self bandaging for LLE to knee at next session so she can try to manage at home while she awaits her compression stocking and to see if she has any further reduction    Recommended Other Services pt to order exostrong size small avg length from [employer]    Consulted and Agree with Plan of Care Patient           Patient will benefit from skilled therapeutic intervention in order to improve the following deficits and impairments:  Increased edema  Visit Diagnosis: Lymphedema, not elsewhere classified     Problem List Patient Active Problem List   Diagnosis Date Noted  . Left leg swelling 03/11/2021  . Long term current use of hormonal contraceptive 05/26/2020  . Encounter for preventive health examination 05/15/2019  . Iron deficiency anemia 09/12/2018  . 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 [place], [place], [address] Phone: [phone]   Fax:  [phone]  Name: Jennifer Mcdowell MRN: [id number] Date of Birth: 04/12/1981

## 2021-05-05 NOTE — Therapy (Addendum)
[facility] [address] [place], [place], [address] Phone: [phone]   Fax:  [phone]  Physical Therapy Evaluation  Patient Details  Name: Jennifer Mcdowell MRN: [id number] Date of Birth: Oct 04, 1981 Referring Provider (PT): Felix Pacini   Encounter Date: 05/05/2021   PT End of Session - 05/05/21 0845    Visit Number 1    Number of Visits 13    Date for PT Re-Evaluation 06/02/21    PT Start Time 0806    PT Stop Time 0850    PT Time Calculation (min) 44 min    Activity Tolerance Patient tolerated treatment well    Behavior During Therapy [facility] for tasks assessed/performed           Past Medical History:  Diagnosis Date  . Gestational diabetes     Past Surgical History:  Procedure Laterality Date  . NO PAST SURGERIES      There were no vitals filed for this visit.    Subjective Assessment - 05/05/21 0809    Subjective The swelling started four months ago. I thought it would go away. I traveled during Thanksgiving to [place] which was a pretty long flight. It does not go past the knee.    Pertinent History anemia    Patient Stated Goals to get the swelling to go away    Currently in Pain? No/denies              [facility] PT Assessment - 05/05/21 0001      Assessment   Medical Diagnosis LLE lymphedema    Referring Provider (PT) Felix Pacini    Onset Date/Surgical Date 12/05/20    Hand Dominance Right    Prior Therapy none      Precautions   Precautions None      Restrictions   Weight Bearing Restrictions No      Balance Screen   Has the patient fallen in the past 6 months No    Has the patient had a decrease in activity level because of a fear of falling?  No    Is the patient reluctant to leave their home because of a fear of falling?  No      Home Nurse, mental health Private residence    Living Arrangements Spouse/significant other;Children    Available Help at Discharge  Family    Type of Home House      Prior Function   Level of Independence Independent    Vocation Part time employment    Leisure pt walks daily for at least a mile      Cognition   Overall Cognitive Status Within Functional Limits for tasks assessed      Observation/Other Assessments   Observations visible swelling at L foot and lower leg - pt has copper compression sock on             LYMPHEDEMA/ONCOLOGY QUESTIONNAIRE - 05/05/21 0001      Type   Cancer Type LLE lymphedema      Lymphedema Assessments   Lymphedema Assessments Lower extremities      Right Lower Extremity Lymphedema   10 cm Proximal to Suprapatella 39.8 cm    At Midpatella/Popliteal Crease 34 cm    30 cm Proximal to Floor at Lateral Plantar Foot 32 cm    20 cm Proximal to Floor at Lateral Plantar Foot 22.6 1    10  cm Proximal to Floor at Lateral Malleoli 18.5 cm    5 cm  Proximal to 1st MTP Joint 19.5 cm    Across MTP Joint 21 cm    Around Proximal Great Toe 7.2 cm      Left Lower Extremity Lymphedema   10 cm Proximal to Suprapatella 40 cm    At Midpatella/Popliteal Crease 35 cm    30 cm Proximal to Floor at Lateral Plantar Foot 32.6 cm    20 cm Proximal to Floor at Lateral Plantar Foot 23 cm    10 cm Proximal to Floor at Lateral Malleoli 21.4 cm    5 cm Proximal to 1st MTP Joint 20 cm    Across MTP Joint 20.5 cm    Around Proximal Great Toe 7.2 cm                 Flowsheet Row Outpatient Rehab from [facility]05/[facility]  Lymphedema Life Impact Scale Total Score 19.12 %      Objective measurements completed on examination: See above findings.       [facility] Adult PT Treatment/Exercise - 05/05/21 0001      Manual Therapy   Manual Therapy Edema management    Edema Management measured pt for an exostrong below knee compression garment and issued info to pt on how to obtain                  PT Education - 05/05/21 0857    Education Details  anatomy and physiology of lymphatic system, possible causes of swelling, how to manage swelling with compression garments, different types of compression garments and how to obtain    Person(s) Educated Patient    Methods Explanation    Comprehension Verbalized understanding               PT Long Term Goals - 05/05/21 0854      PT LONG TERM GOAL #1   Title Pt will obtain appropriate compression garment for long term management of lymphedema.    Time 4    Period Weeks    Status New    Target Date 06/02/21      PT LONG TERM GOAL #2   Title Pt will demonstrate a 1 cm decrease in edema 10 cm proximal to floor at lateral plantar foot to allow improved comfort    Baseline 21.4    Time 4    Period Weeks    Status New    Target Date 06/02/21      PT LONG TERM GOAL #3   Title Pt will be able to indepently manage her lymphedema thorugh self MLD and compression garments    Time 4    Period Weeks    Status New    Target Date 06/02/21                  Plan - 05/05/21 0858    Clinical Impression Statement Pt reports to PT with LLE lymphedema from the knee down. She reports it began about four months ago. Pt did fly internationally around Thanksgiving and reports that usually when she flies she does noticie swelling in bilateral LEs from the knee down but most pronouned at ankle and foot. She reports it could have started around this time and it just never went down like the RLE. Pt has had many different tests to determine the cause of the swelling but everything has been normal. Discussed the fact that it could be primary lymphedema and just started to show up or it could be some venous  insuffiency though this usually is bialteral. Educated pt that she will most likely need to wear compression going forward especially if she is active or flying. Measured pt today and gave her info on how to obtain a flat knit knee high compression garment for the LLE. Will try bandaging to see if  she has any reduction. Even if she does have reduction the exo strong size small will still fit. Pt would benefit from skilled PT services to decrease LLE edema, assist pt with obtaining appropriate compression garments and instruct pt in how to manage lymphedema independently.    Stability/Clinical Decision Making Stable/Uncomplicated    Clinical Decision Making Low    Rehab Potential Excellent    PT Frequency 3x / week    PT Duration 4 weeks    PT Treatment/Interventions ADLs/Self Care Home Management;Therapeutic exercise;Therapeutic activities;Patient/family education;Manual techniques;Manual lymph drainage;Compression bandaging;Taping;Vasopneumatic Device    PT Next Visit Plan MLD to LLE and instruct pt, also instruct pt in self bandaging for LLE to knee at next session so she can try to manage at home while she awaits her compression stocking and to see if she has any further reduction    Recommended Other Services pt to order exostrong size small avg length from [employer]    Consulted and Agree with Plan of Care Patient           Patient will benefit from skilled therapeutic intervention in order to improve the following deficits and impairments:  Increased edema  Visit Diagnosis: Lymphedema, not elsewhere classified - Plan: PT plan of care cert/re-cert     Problem List Patient Active Problem List   Diagnosis Date Noted  . Left leg swelling 03/11/2021  . Long term current use of hormonal contraceptive 05/26/2020  . Encounter for preventive health examination 05/15/2019  . Iron deficiency anemia 09/12/2018  . 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 [place], [place], [address] Phone: [phone]   Fax:  [phone]  Name: Jennifer Mcdowell MRN: [id number] Date of Birth: 06-Sep-1981  Leonette Most, PT 05/05/21 10:01 AM

## 2021-05-11 ENCOUNTER — Other Ambulatory Visit: Payer: Self-pay

## 2021-05-11 ENCOUNTER — Ambulatory Visit: Payer: 59 | Admitting: Physical Therapy

## 2021-05-11 ENCOUNTER — Encounter: Payer: Self-pay | Admitting: Physical Therapy

## 2021-05-11 DIAGNOSIS — I890 Lymphedema, not elsewhere classified: Secondary | ICD-10-CM | POA: Diagnosis not present

## 2021-05-11 NOTE — Therapy (Signed)
[facility] [address] [place], [place], [address] Phone: [phone]   Fax:  [phone]  Physical Therapy Treatment  Patient Details  Name: Jennifer Mcdowell MRN: [id number] Date of Birth: 02-10-1981 Referring Provider (PT): Felix Pacini   Encounter Date: 05/11/2021   PT End of Session - 05/11/21 1044    Visit Number 2    Number of Visits 13    Date for PT Re-Evaluation 06/02/21    PT Start Time 0946    PT Stop Time 1042    PT Time Calculation (min) 56 min    Activity Tolerance Patient tolerated treatment well    Behavior During Therapy [facility] for tasks assessed/performed           Past Medical History:  Diagnosis Date  . Gestational diabetes     Past Surgical History:  Procedure Laterality Date  . NO PAST SURGERIES      There were no vitals filed for this visit.   Subjective Assessment - 05/11/21 0947    Subjective My swelling has been the same but actually it is a little more today. I am still waiting on the compression garments they should be here today or tomorrow.    Pertinent History anemia    Patient Stated Goals to get the swelling to go away    Currently in Pain? No/denies    Multiple Pain Sites No                     Flowsheet Row Outpatient Rehab from 05/05/2021 in [facility]  Lymphedema Life Impact Scale Total Score 19.12 %            [facility] Adult PT Treatment/Exercise - 05/11/21 0001      Manual Therapy   Manual Therapy Compression Bandaging    Compression Bandaging to LLE from foot to knee: therapist applied each bandage first then removed and had pt redon with v/c and tactile cues: stockinette, artiflex, elastomull to digits 1-5 with 4&5 wrapped together, 1 6cm at foot, 1 8 cm at ankle and 1 10 cm from ankle to knee then took all bandages off and redonned while pt videoed                       PT Long Term Goals -  05/05/21 0854      PT LONG TERM GOAL #1   Title Pt will obtain appropriate compression garment for long term management of lymphedema.    Time 4    Period Weeks    Status New    Target Date 06/02/21      PT LONG TERM GOAL #2   Title Pt will demonstrate a 1 cm decrease in edema 10 cm proximal to floor at lateral plantar foot to allow improved comfort    Baseline 21.4    Time 4    Period Weeks    Status New    Target Date 06/02/21      PT LONG TERM GOAL #3   Title Pt will be able to indepently manage her lymphedema thorugh self MLD and compression garments    Time 4    Period Weeks    Status New    Target Date 06/02/21                 Plan - 05/11/21 1044    Clinical Impression Statement Instructed pt in L LE wrapping technique from foot to knee  including toe bandages. Therapist donned bandage while verbally educating pt in technique then removed bandage and had pt return demonstrate while therapist provided occasional verbal and tactile cues. Therapist typed out directions for pt and pt also videotaped therapist applying toe bandages and short stretch bandages from foot to knee. Pt is still awaiting arrival of flat knit compression garments. Educated pt to remove bandage if she has any numbness, tingling or pain or if her toes turn blue and to try reapplying with less tension. Also encouraged pt to be active with bandages intact.    PT Frequency 3x / week    PT Duration 4 weeks    PT Treatment/Interventions ADLs/Self Care Home Management;Therapeutic exercise;Therapeutic activities;Patient/family education;Manual techniques;Manual lymph drainage;Compression bandaging;Taping;Vasopneumatic Device    PT Next Visit Plan MLD to LLE and instruct pt, also cont instruct pt in self bandaging for LLE to knee as needed, see if she got her compression stocking    Consulted and Agree with Plan of Care Patient           Patient will benefit from skilled therapeutic intervention in order  to improve the following deficits and impairments:  Increased edema  Visit Diagnosis: Lymphedema, not elsewhere classified     Problem List Patient Active Problem List   Diagnosis Date Noted  . Left leg swelling 03/11/2021  . Long term current use of hormonal contraceptive 05/26/2020  . Encounter for preventive health examination 05/15/2019  . Iron deficiency anemia 09/12/2018  . 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

## 2021-05-13 ENCOUNTER — Encounter: Payer: 59 | Admitting: Physical Therapy

## 2021-05-13 ENCOUNTER — Encounter: Payer: Self-pay | Admitting: Physical Therapy

## 2021-05-17 ENCOUNTER — Ambulatory Visit: Payer: 59 | Admitting: Physical Therapy

## 2021-05-17 ENCOUNTER — Encounter: Payer: Self-pay | Admitting: Physical Therapy

## 2021-05-17 ENCOUNTER — Other Ambulatory Visit: Payer: Self-pay

## 2021-05-17 DIAGNOSIS — I890 Lymphedema, not elsewhere classified: Secondary | ICD-10-CM | POA: Diagnosis not present

## 2021-05-17 NOTE — Therapy (Signed)
[facility] [address] [place], [place], [address] Phone: [phone]   Fax:  [phone]  Physical Therapy Treatment  Patient Details  Name: Jennifer Mcdowell MRN: [id number] Date of Birth: 1981/08/14 Referring Provider (PT): Jennifer Mcdowell   Encounter Date: 05/17/2021   PT End of Session - 05/17/21 1158     Visit Number 3    Number of Visits 13    Date for PT Re-Evaluation 06/02/21    PT Start Time 1107    PT Stop Time 1153    PT Time Calculation (min) 46 min    Activity Tolerance Patient tolerated treatment well    Behavior During Therapy Jennifer Mcdowell for tasks assessed/performed             Past Medical History:  Diagnosis Date   Gestational diabetes     Past Surgical History:  Procedure Laterality Date   NO PAST SURGERIES      There were no vitals filed for this visit.   Subjective Assessment - 05/17/21 1109     Subjective I got the compression garments and I have been wearing it. All of the swelling went down with the bandaging except at the ankle. When I tried to bandage myself I had pain so I have just been wearing the stocking.    Pertinent History anemia    Patient Stated Goals to get the swelling to go away    Currently in Pain? No/denies    Multiple Pain Sites No                   LYMPHEDEMA/ONCOLOGY QUESTIONNAIRE - 05/17/21 0001       Left Lower Extremity Lymphedema   10 cm Proximal to Floor at Lateral Malleoli 20.2 cm    5 cm Proximal to 1st MTP Joint 19.6 cm    Across MTP Joint 19.2 cm    Around Proximal Great Toe 6.8 cm                Flowsheet Row Outpatient Rehab from 05/05/2021 [facility]  Lymphedema Life Impact Scale Total Score 19.12 %             [facility] Adult PT Treatment/Exercise - 05/17/21 0001       Manual Therapy   Manual Therapy Compression Bandaging;Manual Lymphatic Drainage (MLD)    Manual Lymphatic  Drainage (MLD) short neck, 5 diaphragmatic breaths, left axillary nodes and establishment of inguinal axillo pathway, left inguinal nodes, LLE working proximal to distal then retracing all steps with extra time spent on ankle where pt is Mcdowell swollen    Compression Bandaging to LLE from foot to knee:stockinette, 1/2 inch grey foam wheels to medial and lateral ankle, artiflex, elastomull to digits 1-3, 1 6cm at foot, 1 8 cm at ankle and 1 10 cm from ankle to knee                         PT Long Term Goals - 05/05/21 0854       PT LONG TERM GOAL #1   Title Pt will obtain appropriate compression garment for long term management of lymphedema.    Time 4    Period Weeks    Status New    Target Date 06/02/21      PT LONG TERM GOAL #2   Title Pt will demonstrate a 1 cm decrease in edema 10 cm proximal to floor at lateral plantar foot  to allow improved comfort    Baseline 21.4    Time 4    Period Weeks    Status New    Target Date 06/02/21      PT LONG TERM GOAL #3   Title Pt will be able to indepently manage her lymphedema thorugh self MLD and compression garments    Time 4    Period Weeks    Status New    Target Date 06/02/21                   Plan - 05/17/21 1159     Clinical Impression Statement Pt received her exo strong knee high garment and has been wearing it since Friday when she had to remove her bandages. She attempted reapplying her bandages but had increased discomfort around the forefoot. Her exo strong fits well and pt demonstrates a decrease in edema throughout foot and lower leg but she still has increased swelling at left medial and lateral ankle. Began MLD today while instructing pt in correct technique and sequence as well as anatomy and physiology of the lymphatic system. Then reapplied bandaging while instructing pt in correct technique. Educated pt to apply less tension around forefoot to decrease discomfort. Added foam wheels to ankle today to  help decrease swelling.    PT Frequency 3x / week    PT Duration 4 weeks    PT Treatment/Interventions ADLs/Self Care Home Management;Therapeutic exercise;Therapeutic activities;Patient/family education;Manual techniques;Manual lymph drainage;Compression bandaging;Taping;Vasopneumatic Device    PT Next Visit Plan MLD to LLE and instruct pt issue handout, also cont instruct pt in self bandaging for LLE to knee as needed, see if she got her compression stocking    Consulted and Agree with Plan of Care Patient             Patient will benefit from skilled therapeutic intervention in order to improve the following deficits and impairments:  Increased edema  Visit Diagnosis: Lymphedema, not elsewhere classified     Problem List Patient Active Problem List   Diagnosis Date Noted   Left leg swelling 03/11/2021   Long term current use of hormonal contraceptive 05/26/2020   Encounter for preventive health examination 05/15/2019   Iron deficiency anemia 09/12/2018   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

## 2021-05-19 ENCOUNTER — Other Ambulatory Visit: Payer: Self-pay

## 2021-05-19 ENCOUNTER — Ambulatory Visit: Payer: 59

## 2021-05-19 DIAGNOSIS — I890 Lymphedema, not elsewhere classified: Secondary | ICD-10-CM

## 2021-05-19 NOTE — Patient Instructions (Addendum)
PLEASE KEEP YOUR BANDAGES ON AS LONG AS POSSIBLE TO GET THE BEST SWELLING REDUCTION. Should your bandages become uncomfortable or feel too tight, follow these steps: Elevate your extremity higher than your heart.  Try to move your arm or leg joints against the firmness of the bandage to help with moving the fluid and allow the bandages to loosen a bit.  If the bandaging is still is too tight, it is ok to carefully remove the top layer.  There will still be more layers under it that can provide compression to your extremity. Finally, if you STILL have significant pain after trying these steps, it is ok to take the bandage off.  Check your skin carefully for any signs of irritation  PLEASE bring ALL bandage materials back to your next appointment as we will reuse what we can TAKE CARE OF YOUR BANDAGES SO THEY WILL LAST LONGER AND STAY IN BETTER CONDITION Washing bandages:  Wash periodically using a mild detergent in warm water.  Do not use fabric softener or bleach.  Place bandages in a mesh lingerie bag or in a tied off pillow case and use the gentle cycle of the washing machine or hand wash. If you hand wash, you may want to put them in the spin cycle of your washer to get the extra water out, but make sure you put them in a mesh bag first. Do not wring or stretch them while they are wet.  Drying bandages: Lay the bandages out smoothly on a towel away from direct sunlight or heating sources that can damage the fabric. Rolling bandages in a towel and gently squeezing the towel to remove excess water before laying them out can speed up the process.  If you use a drying rack, place a towel on top of the rack to lay the bandages on.  If they hang down to dry, they fabric could be stretched out and the bandage will lose its compression.   Or, keep bandages in the mesh bag and dry them in the dryer on the low or no heat cycle. Rolling bandages: Please roll your bandages after drying them so they are ready for  your next treatment. If they are rolled too loose, they will be difficult to apply.  If rolled too tight, they can get stretched out.   TAKE CARE OF YOUR SKIN Apply a low pH moisturizing lotion to your skin daily Avoid scratching your skin Treat skin irritations quickly  Know the 5 warning signs of infection: redness, pain, warmth to touch, fever and increased swelling.  Call your physician immediately if you notice any of these signs of a possible infection.   Pt was also given written instructions for self MLD

## 2021-05-19 NOTE — Therapy (Signed)
[facility] [address] [place], [place], [address] Phone: [phone]   Fax:  [phone]  Physical Therapy Treatment  Patient Details  Name: Jennifer Mcdowell MRN: [id number] Date of Birth: 08-31-1981 Referring Provider (PT): Felix Pacini   Encounter Date: 05/19/2021   PT End of Session - 05/19/21 1202     Visit Number 4    Number of Visits 13    Date for PT Re-Evaluation 06/02/21    PT Start Time 1103    PT Stop Time 1154    PT Time Calculation (min) 51 min    Activity Tolerance Patient tolerated treatment well    Behavior During Therapy [facility] for tasks assessed/performed             Past Medical History:  Diagnosis Date   Gestational diabetes     Past Surgical History:  Procedure Laterality Date   NO PAST SURGERIES      There were no vitals filed for this visit.   Subjective Assessment - 05/19/21 1158     Subjective I have on the wraps that were put on in PT last visit.  I wouldl like to wear my stocking home today so I can shower.  When I wear the stocking there is a ridge at my foot by the end of the day and my ankle is more swollen.    Pertinent History anemia    Patient Stated Goals to get the swelling to go away    Currently in Pain? No/denies    Multiple Pain Sites No                       Flowsheet Row Outpatient Rehab [facility]  Lymphedema Life Impact Scale Total Score 19.12 %             [facility] Adult PT Treatment/Exercise - 05/19/21 0001       Manual Therapy   Manual Therapy Edema management;Manual Lymphatic Drainage (MLD)    Manual Lymphatic Drainage (MLD) pt was instructed in self MLD to the left LE,short neck, 5 diaphragmatic breaths, left axillary nodes and establishment of inguinal axillo pathway, left inguinal nodes, LLE working proximal to distal then retracing all steps with emphasis on spending extra  time spent on ankle where pt is most swollen . Therapist demonstrated and pt return demonstrated all steps   Compression Bandaging Pt was shown how to apply compression stocking using donning gloves                    PT Education - 05/19/21 1157     Education Details Pt was educated in self MLD to the left leg    Person(s) Educated Patient    Methods Explanation;Demonstration;Handout    Comprehension Returned demonstration;Verbalized understanding;Need further instruction                 PT Long Term Goals - 05/05/21 0854       PT LONG TERM GOAL #1   Title Pt will obtain appropriate compression garment for long term management of lymphedema.    Time 4    Period Weeks    Status New    Target Date 06/02/21      PT LONG TERM GOAL #2   Title Pt will demonstrate a 1 cm decrease in edema 10 cm proximal to floor at lateral plantar foot to allow improved comfort    Baseline 21.4  Time 4    Period Weeks    Status New    Target Date 06/02/21      PT LONG TERM GOAL #3   Title Pt will be able to indepently manage her lymphedema thorugh self MLD and compression garments    Time 4    Period Weeks    Status New    Target Date 06/02/21                   Plan - 05/19/21 1202     Clinical Impression Statement Pts wraps from previous visit were removed and leg assessed.  Swelling still pitting at medial ankle after removing wheels made for swelling, less swelling noted at lateral ankle but still mild behind lateral malleolus.  Pt was instructed in self MLD in prop sitting so she could reach her leg.  All steps were demonstrated and then pt return demonstrated. She did exceptionally well and had a good understanding of the sequence and stretch techniques.  She was shown how to don her stocking using donning gloves.  She would like her demographics sent to [employer] and that will be done today. Pt was given information for washing bandages and we also discussed  bandaging as she needs to to maintain swelling    Stability/Clinical Decision Making Stable/Uncomplicated    Rehab Potential Excellent    PT Frequency 3x / week    PT Duration 4 weeks    PT Treatment/Interventions ADLs/Self Care Home Management;Therapeutic exercise;Therapeutic activities;Patient/family education;Manual techniques;Manual lymph drainage;Compression bandaging;Taping;Vasopneumatic Device    PT Next Visit Plan MLD and review MLD with pt. 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 [place], [place], [address] Phone: [phone]   Fax:  [phone]  Name: Jennifer Mcdowell MRN: [id number] Date of Birth: 1980/12/29  Alvira Monday, PT 05/19/21 12:13 PM

## 2021-05-21 ENCOUNTER — Other Ambulatory Visit: Payer: Self-pay

## 2021-05-21 ENCOUNTER — Ambulatory Visit: Payer: 59 | Admitting: Physical Therapy

## 2021-05-21 DIAGNOSIS — I890 Lymphedema, not elsewhere classified: Secondary | ICD-10-CM

## 2021-05-21 NOTE — Therapy (Signed)
[facility] [address] [place], [place], [address] Phone: [phone]   Fax:  [phone]  Physical Therapy Treatment  Patient Details  Name: Bobbi Yount MRN: [id number] Date of Birth: [birth year] Referring Provider (PT): Felix Pacini   Encounter Date: 05/21/2021   PT End of Session - 05/21/21 1219     Visit Number 5    Number of Visits 13    Date for PT Re-Evaluation 06/02/21    PT Start Time 1000    PT Stop Time 1050    PT Time Calculation (min) 50 min    Activity Tolerance Patient tolerated treatment well    Behavior During Therapy [facility] for tasks assessed/performed             Past Medical History:  Diagnosis Date   Gestational diabetes     Past Surgical History:  Procedure Laterality Date   NO PAST SURGERIES      There were no vitals filed for this visit.   Subjective Assessment - 05/21/21 1214     Subjective Pt comes in wearing compression stocking.  She plans to take a short plane flight next Tues-Thurs so will need to cancel next weds    Patient Stated Goals to get the swelling to go away    Currently in Pain? No/denies                       Flowsheet Row Outpatient Rehab from [facility]/05/2021 [facility]  Lymphedema Life Impact Scale Total Score 19.12 %             [facility] Adult PT Treatment/Exercise - 05/21/21 0001       Manual Therapy   Manual Therapy Manual Lymphatic Drainage (MLD);Edema management;Compression Bandaging    Manual therapy comments pt able to verbalize how to do self MLD. reinforced elevation and exercise.  Encouraged pt to do heel raises and jumping in the pool wiht legs down in deeper water as well as leg swings. Pt also told it was ok to run on treadmill with her compression stocking on.    Edema Management showed pt options for ankle /LE compression on compression guru Printed out LE tribute wrap with sleeve  that she may consider as alternative for bandaging.    Manual Lymphatic Drainage (MLD) short neck, 5 diaphragmatic breaths, left axillary nodes and establishment of inguinal axillo pathway, left inguinal nodes, LLE working proximal to distal then retracing all steps with extra time spent on ankle where pt is most swollen    Compression Bandaging made lined and dotted peach foam patch with extra foam to go around achilled tendon to increase compression around [employer].  placed on top of tg soft and under artiflex x 2. followed by 6, 8, 10 inch short stretch                         PT Long Term Goals - 05/05/21 0854       PT LONG TERM GOAL #1   Title Pt will obtain appropriate compression garment for long term management of lymphedema.    Time 4    Period Weeks    Status New    Target Date 06/02/21      PT LONG TERM GOAL #2   Title Pt will demonstrate a 1 cm decrease in edema 10 cm proximal to floor at lateral plantar foot to allow improved comfort  Baseline 21.4    Time 4    Period Weeks    Status New    Target Date 06/02/21      PT LONG TERM GOAL #3   Title Pt will be able to indepently manage her lymphedema thorugh self MLD and compression garments    Time 4    Period Weeks    Status New    Target Date 06/02/21                   Plan - 05/21/21 1220     Clinical Impression Statement Pt continues to have good reduction in foot and leg with persistent congestion around malleolil Upgraded foam for this area and also gave her chip packs to try as an alternative. Pt is interested in a nighttime garment and gave her a picture of the Tribuete Wrap LE to consider    Stability/Clinical Decision Making Stable/Uncomplicated    Rehab Potential Excellent    PT Frequency 3x / week    PT Duration 4 weeks    PT Treatment/Interventions ADLs/Self Care Home Management;Therapeutic exercise;Therapeutic activities;Patient/family education;Manual techniques;Manual lymph  drainage;Compression bandaging;Taping;Vasopneumatic Device    PT Next Visit Plan MLD and review MLD with pt. 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 [address] [place], [place], [address] Phone: [phone]   Fax:  [phone]  Name: Aretta Stetzel MRN: [id number] Date of Birth: November 23, 1981

## 2021-05-24 ENCOUNTER — Other Ambulatory Visit: Payer: Self-pay

## 2021-05-24 ENCOUNTER — Ambulatory Visit: Payer: 59

## 2021-05-24 DIAGNOSIS — I890 Lymphedema, not elsewhere classified: Secondary | ICD-10-CM | POA: Diagnosis not present

## 2021-05-24 NOTE — Therapy (Signed)
[facility] [address] [place], [place], [address] Phone: [phone]   Fax:  [phone]  Physical Therapy Treatment  Patient Details  Name: Jennifer Mcdowell MRN: [id number] Date of Birth: 10/02/1981 Referring Provider (PT): Felix Pacini   Encounter Date: 05/24/2021   PT End of Session - 05/24/21 0856     Visit Number 6    Number of Visits 13    Date for PT Re-Evaluation 06/02/21    PT Start Time 0803    PT Stop Time 0852    PT Time Calculation (min) 49 min    Activity Tolerance Patient tolerated treatment well    Behavior During Therapy [facility] for tasks assessed/performed             Past Medical History:  Diagnosis Date   Gestational diabetes     Past Surgical History:  Procedure Laterality Date   NO PAST SURGERIES      There were no vitals filed for this visit.   Subjective Assessment - 05/24/21 0801     Subjective Took the wrap off on Saturday morning.  ankle looked much better. I didn't wear stocking on Saturday but put the stocking on and went to sleep. I wore it Sunday during the day and took it off at night. I did the MLD yesterday and I think I did it right.    Pertinent History anemia    Patient Stated Goals to get the swelling to go away    Currently in Pain? No/denies                       Flowsheet Row Outpatient Rehab from [facility]/05/20[facility]  Lymphedema Life Impact Scale Total Score 19.12 %             [facility] Adult PT Treatment/Exercise - 05/24/21 0001       Manual Therapy   Edema Management Pt thinks she will get the tribute night    Manual Lymphatic Drainage (MLD) Reviewed self MLD to the left LE,short neck, 5 diaphragmatic breaths, left axillary nodes and establishment of inguinal axillo pathway, left inguinal nodes, LLE working proximal to distal then retracing all steps with emphasis on spending extra time spent on  ankle where pt is most swollen.  Pt did exceptionally well with very few modifications made.    Compression Bandaging pt wrapped with TG soft and trial of chip pack at both malleoli under TG soft, artiflex, 6 cm with figure 8's, 8 cm with heel locks and 10 cm ankle to knee. Pt shown how to do ankle circles/PF/DF to loosen wraps                         PT Long Term Goals - 05/05/21 0854       PT LONG TERM GOAL #1   Title Pt will obtain appropriate compression garment for long term management of lymphedema.    Time 4    Period Weeks    Status New    Target Date 06/02/21      PT LONG TERM GOAL #2   Title Pt will demonstrate a 1 cm decrease in edema 10 cm proximal to floor at lateral plantar foot to allow improved comfort    Baseline 21.4    Time 4    Period Weeks    Status New    Target Date 06/02/21      PT  LONG TERM GOAL #3   Title Pt will be able to indepently manage her lymphedema thorugh self MLD and compression garments    Time 4    Period Weeks    Status New    Target Date 06/02/21                   Plan - 05/24/21 0856     Clinical Impression Statement Discussed proper wearing of stocking and that it is not made to wear at night.  Pt came in with stocking on today and swollen at bilateral malleoli.  We reviewed pt performance of self MLD and pt did exceptionally well with very few corrections needed. Pt was wrapped today with trial of chip packs at bilateral malleoli to see if this would do any better reducing pitting. Discussed pt wearing compression on her right leg as well for her flight tomorrow.    Stability/Clinical Decision Making Stable/Uncomplicated    Rehab Potential Excellent    PT Frequency 3x / week    PT Duration 4 weeks    PT Treatment/Interventions ADLs/Self Care Home Management;Therapeutic exercise;Therapeutic activities;Patient/family education;Manual techniques;Manual lymph drainage;Compression bandaging;Taping;Vasopneumatic  Device    PT Next Visit Plan perform MLD, see how chip pack did, bandage, tribute night measure for?    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

## 2021-05-28 ENCOUNTER — Other Ambulatory Visit: Payer: Self-pay

## 2021-05-28 ENCOUNTER — Ambulatory Visit: Payer: 59 | Admitting: Physical Therapy

## 2021-05-28 DIAGNOSIS — I890 Lymphedema, not elsewhere classified: Secondary | ICD-10-CM | POA: Diagnosis not present

## 2021-05-28 NOTE — Therapy (Signed)
[facility] [address] [place], [place], [address] Phone: [phone]   Fax:  [phone]  Physical Therapy Treatment  Patient Details  Name: Jennifer Mcdowell MRN: [id number] Date of Birth: 1981/02/04 Referring Provider (PT): Felix Pacini   Encounter Date: 05/28/2021   PT End of Session - 05/28/21 1007     Visit Number 7    Number of Visits 13    Date for PT Re-Evaluation 06/02/21    PT Start Time 0900    PT Stop Time 0950    PT Time Calculation (min) 50 min    Activity Tolerance Patient tolerated treatment well    Behavior During Therapy [facility] for tasks assessed/performed             Past Medical History:  Diagnosis Date   Gestational diabetes     Past Surgical History:  Procedure Laterality Date   NO PAST SURGERIES      There were no vitals filed for this visit.   Subjective Assessment - 05/28/21 1001     Subjective Pt states the chip packs worked well after wrapping on Monday, but she noticed she had more swelling in the front and top of the foot after wearing them.    Pertinent History anemia    Patient Stated Goals to get the swelling to go away    Currently in Pain? No/denies                       Flowsheet Row Outpatient Rehab from 05/05/2021 [facility]  Lymphedema Life Impact Scale Total Score 19.12 %             [facility] Adult PT Treatment/Exercise - 05/28/21 0001       Manual Therapy   Manual Therapy Manual Lymphatic Drainage (MLD);Edema management;Compression Bandaging    Edema Management Pt states she shoulde be getting tibute wrap today and will try wearing that with chip packs under it for extra compression around malleoli and achilles tendon    Manual Lymphatic Drainage (MLD) short neck, 5 diaphragmatic breaths, left axillary nodes and establishment of inguinal axillo pathway,right inguinal nodes and anterior interinguinal  anastamosis left inguinal nodes, LLE working proximal to distal then retracing all steps with extra time spent on ankle where pt is most swollen    Compression Bandaging pt wrapped with TG soft and  chip pack at both malleoli with 1/4 inch gray foam strip at both sides of achilles tendon and extra chip pack on top of foot artiflex, 6 cm with figure 8's, 8 cm with heel locks and 10 cm ankle to knee. Pt shown how to do ankle circles/PF/DF to loosen wraps                         PT Long Term Goals - 05/05/21 0854       PT LONG TERM GOAL #1   Title Pt will obtain appropriate compression garment for long term management of lymphedema.    Time 4    Period Weeks    Status New    Target Date 06/02/21      PT LONG TERM GOAL #2   Title Pt will demonstrate a 1 cm decrease in edema 10 cm proximal to floor at lateral plantar foot to allow improved comfort    Baseline 21.4    Time 4    Period Weeks    Status New  Target Date 06/02/21      PT LONG TERM GOAL #3   Title Pt will be able to indepently manage her lymphedema thorugh self MLD and compression garments    Time 4    Period Weeks    Status New    Target Date 06/02/21                   Plan - 05/28/21 1007     Clinical Impression Statement Pt continues to have persistend boggy lymphedema around malleoli and along achilles tendon.  Extra time spent on this today with extra foam along tendon and extra chip pack at anterior foot.  Pt will try the [place] [employer] with foam chip packs also    Stability/Clinical Decision Making Stable/Uncomplicated    Rehab Potential Excellent    PT Frequency 3x / week    PT Treatment/Interventions ADLs/Self Care Home Management;Therapeutic exercise;Therapeutic activities;Patient/family education;Manual techniques;Manual lymph drainage;Compression bandaging;Taping;Vasopneumatic Device    PT Next Visit Plan see how chip packs did, Shaw[place] Knapp ok?  remeasure, check goals. and talk about  schedule for recheck in about a month at d/c???    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 Manson Passey PT  Donnetta Hail 05/28/2021, 10:58 AM  [facility] [address] [place], [place], [address] Phone: [phone]   Fax:  [phone]  Name: Jennifer Mcdowell MRN: [id number] Date of Birth: 1981-01-30

## 2021-05-31 ENCOUNTER — Ambulatory Visit: Payer: 59 | Admitting: Physical Therapy

## 2021-05-31 ENCOUNTER — Other Ambulatory Visit: Payer: Self-pay

## 2021-05-31 ENCOUNTER — Encounter: Payer: Self-pay | Admitting: Physical Therapy

## 2021-05-31 DIAGNOSIS — I890 Lymphedema, not elsewhere classified: Secondary | ICD-10-CM

## 2021-05-31 NOTE — Therapy (Signed)
[facility] [address] [place], [place], [address] Phone: [phone]   Fax:  [phone]  Physical Therapy Treatment  Patient Details  Name: Jennifer Mcdowell MRN: [id number] Date of Birth: 20-Apr-1981 Referring Provider (PT): Felix Pacini   Encounter Date: 05/31/2021   PT End of Session - 05/31/21 0858     Visit Number 8    Number of Visits 13    Date for PT Re-Evaluation 06/02/21    PT Start Time 0807    PT Stop Time 0855    PT Time Calculation (min) 48 min    Activity Tolerance Patient tolerated treatment well    Behavior During Therapy [facility] for tasks assessed/performed             Past Medical History:  Diagnosis Date   Gestational diabetes     Past Surgical History:  Procedure Laterality Date   NO PAST SURGERIES      There were no vitals filed for this visit.   Subjective Assessment - 05/31/21 0808     Subjective I have been wearing the tribute wrap at night and it is helping a lot. It is not only pooling around the ankle but everything else is doing much better. I used the chip pack in the night time garment.    Pertinent History anemia    Patient Stated Goals to get the swelling to go away    Currently in Pain? No/denies                       Flowsheet Row Outpatient Rehab from [facility]/05/20[facility]  Lymphedema Life Impact Scale Total Score 19.12 %             [facility] Adult PT Treatment/Exercise - 05/31/21 0001       Manual Therapy   Edema Management Pt will attempt to manage lymphedema through flat knit compression stocking and tribute wrap until next session    Manual Lymphatic Drainage (MLD) short neck, 5 diaphragmatic breaths, left axillary nodes and establishment of inguinal axillo pathway,right inguinal nodes and anterior interinguinal anastamosis left inguinal nodes, LLE working proximal to distal then retracing all steps  with extra time spent on ankle where pt is most swollen                         PT Long Term Goals - 05/05/21 0854       PT LONG TERM GOAL #1   Title Pt will obtain appropriate compression garment for long term management of lymphedema.    Time 4    Period Weeks    Status New    Target Date 06/02/21      PT LONG TERM GOAL #2   Title Pt will demonstrate a 1 cm decrease in edema 10 cm proximal to floor at lateral plantar foot to allow improved comfort    Baseline 21.4    Time 4    Period Weeks    Status New    Target Date 06/02/21      PT LONG TERM GOAL #3   Title Pt will be able to indepently manage her lymphedema thorugh self MLD and compression garments    Time 4    Period Weeks    Status New    Target Date 06/02/21                   Plan - 05/31/21  8251     Clinical Impression Statement Pt returns to PT and received her tribute wrap on Friday afternoon. She left bandages intact until Saturday evening and used the tribute wrap with chip packs at ankles. She reports when she removed the bandages all of the swelling in her left foot was gone. The only swelling that returned after wearing her compression garments was at bilateral malleoli. Pt feels the night time garment helped a lot. Pt may need a flat knit class 2 daytime garment with pads built in at ankles. Will assess need for this at next session after seeing how her swelling is wearing flat knit garments during day and tribute night at night.    Stability/Clinical Decision Making Stable/Uncomplicated    PT Frequency 3x / week    PT Duration 4 weeks    PT Treatment/Interventions ADLs/Self Care Home Management;Therapeutic exercise;Therapeutic activities;Patient/family education;Manual techniques;Manual lymph drainage;Compression bandaging;Taping;Vasopneumatic Device    PT Next Visit Plan see how ankles did with garment use, does pt need flat knit class 2?, Tribute Wrap ok?  remeasure, check goals. and  talk about schedule for recheck in about a month at d/c???    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

## 2021-06-02 ENCOUNTER — Encounter: Payer: Self-pay | Admitting: Physical Therapy

## 2021-06-02 ENCOUNTER — Ambulatory Visit: Payer: 59 | Admitting: Physical Therapy

## 2021-06-02 ENCOUNTER — Other Ambulatory Visit: Payer: Self-pay

## 2021-06-02 DIAGNOSIS — I890 Lymphedema, not elsewhere classified: Secondary | ICD-10-CM | POA: Diagnosis not present

## 2021-06-02 NOTE — Therapy (Signed)
[facility] [address] [place], [place], [address] Phone: [phone]   Fax:  [phone]  Physical Therapy Treatment  Patient Details  Name: Gwynneth Fabio MRN: [id number] Date of Birth: 1981/01/16 Referring Provider (PT): Felix Pacini   Encounter Date: 06/02/2021   PT End of Session - 06/02/21 1056     Visit Number 9    Number of Visits 13    Date for PT Re-Evaluation 06/02/21    PT Start Time 1005    PT Stop Time 1055    PT Time Calculation (min) 50 min    Activity Tolerance Patient tolerated treatment well    Behavior During Therapy [facility] for tasks assessed/performed             Past Medical History:  Diagnosis Date   Gestational diabetes     Past Surgical History:  Procedure Laterality Date   NO PAST SURGERIES      There were no vitals filed for this visit.   Subjective Assessment - 06/02/21 1008     Subjective The ankle is not worse but it is still swollen after wearing the compression garments.    Pertinent History anemia    Patient Stated Goals to get the swelling to go away    Currently in Pain? No/denies                       Flowsheet Row Outpatient Rehab from 05/05/2021 [facility]  Lymphedema Life Impact Scale Total Score 19.12 %             [facility] Adult PT Treatment/Exercise - 06/02/21 0001       Manual Therapy   Edema Management scheduled pt to be measured for a class 2 flat knit garment on Friday    Manual Lymphatic Drainage (MLD) short neck, 5 diaphragmatic breaths, left axillary nodes and establishment of inguinal axillo pathway,right inguinal nodes and anterior interinguinal anastamosis left inguinal nodes, LLE working proximal to distal then retracing all steps with extra time spent on ankle where pt is most swollen                         PT Long Term Goals - 05/05/21 0854       PT LONG TERM  GOAL #1   Title Pt will obtain appropriate compression garment for long term management of lymphedema.    Time 4    Period Weeks    Status New    Target Date 06/02/21      PT LONG TERM GOAL #2   Title Pt will demonstrate a 1 cm decrease in edema 10 cm proximal to floor at lateral plantar foot to allow improved comfort    Baseline 21.4    Time 4    Period Weeks    Status New    Target Date 06/02/21      PT LONG TERM GOAL #3   Title Pt will be able to indepently manage her lymphedema thorugh self MLD and compression garments    Time 4    Period Weeks    Status New    Target Date 06/02/21                   Plan - 06/02/21 1056     Clinical Impression Statement Pt has been wearing her flat knit compression sleeve and tribute night time garment since her last session. She reports  her ankle has remained the same and has not worsened. She does still present with swelling around medial and lateral malleoli. It goes away overnight with use of night time garment but returns during the day despite wearing her flat knit compression stocking. Pt would benefit from a class 2 flat knit compression stocking for improved containment.    PT Frequency 1x / week    PT Duration 4 weeks    PT Treatment/Interventions ADLs/Self Care Home Management;Therapeutic exercise;Therapeutic activities;Patient/family education;Manual techniques;Manual lymph drainage;Compression bandaging;Taping;Vasopneumatic Device    PT Next Visit Plan did pt receive class 2 garment, remeasure and recert or d/c    Consulted and Agree with Plan of Care Patient             Patient will benefit from skilled therapeutic intervention in order to improve the following deficits and impairments:  Increased edema  Visit Diagnosis: Lymphedema, not elsewhere classified     Problem List Patient Active Problem List   Diagnosis Date Noted   Left leg swelling 03/11/2021   Long term current use of hormonal contraceptive  05/26/2020   Encounter for preventive health examination 05/15/2019   Iron deficiency anemia 09/12/2018   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 [place], [place], [address] Phone: [phone]   Fax:  [phone]  Name: Hilja Kintzel MRN: [id number] Date of Birth: 16-Jul-1981  Leonette Most, PT 06/02/21 10:59 AM

## 2021-06-04 ENCOUNTER — Ambulatory Visit: Payer: 59 | Attending: Family Medicine | Admitting: Rehabilitation

## 2021-06-04 ENCOUNTER — Encounter: Payer: Self-pay | Admitting: Rehabilitation

## 2021-06-04 ENCOUNTER — Other Ambulatory Visit: Payer: Self-pay

## 2021-06-04 DIAGNOSIS — I890 Lymphedema, not elsewhere classified: Secondary | ICD-10-CM | POA: Insufficient documentation

## 2021-06-04 NOTE — Therapy (Signed)
[facility] [address] [place], [place], [address] Phone: [phone]   Fax:  [phone]  Physical Therapy Treatment  Patient Details  Name: Devlyn Parish MRN: [id number] Date of Birth: [birth year] Referring Provider (PT): Felix Pacini   Encounter Date: 06/04/2021   PT End of Session - 06/04/21 1135     Visit Number 10    Number of Visits 13    Date for PT Re-Evaluation 06/02/21    PT Start Time 1100    PT Stop Time 1120    PT Time Calculation (min) 20 min    Activity Tolerance Patient tolerated treatment well    Behavior During Therapy [facility] for tasks assessed/performed             Past Medical History:  Diagnosis Date   Gestational diabetes     Past Surgical History:  Procedure Laterality Date   NO PAST SURGERIES      There were no vitals filed for this visit.   Subjective Assessment - 06/04/21 1133     Subjective Pt here for compression garment measuring    Currently in Pain? No/denies                       Flowsheet Row Outpatient Rehab from 05/05/2021 [facility]  Lymphedema Life Impact Scale Total Score 19.12 %             [facility] Adult PT Treatment/Exercise - 06/04/21 0001       Prosthetics   Prosthetic Care Comments  pt was measured for custom juzo expert knee high garment with measurements faxed to [facility] 06/04/21 - final order will be in media section once finalized.  Discussed garment care, and how remakes work                         PT Long Term Goals - 05/05/21 0854       PT LONG TERM GOAL #1   Title Pt will obtain appropriate compression garment for long term management of lymphedema.    Time 4    Period Weeks    Status New    Target Date 06/02/21      PT LONG TERM GOAL #2   Title Pt will demonstrate a 1 cm decrease in edema 10 cm proximal to floor at lateral plantar foot to allow improved comfort     Baseline 21.4    Time 4    Period Weeks    Status New    Target Date 06/02/21      PT LONG TERM GOAL #3   Title Pt will be able to indepently manage her lymphedema thorugh self MLD and compression garments    Time 4    Period Weeks    Status New    Target Date 06/02/21                   Plan - 06/04/21 1135     Clinical Impression Statement Pt measured for custom knee high stockings today.    PT Frequency 1x / week    PT Duration 4 weeks    PT Treatment/Interventions ADLs/Self Care Home Management;Therapeutic exercise;Therapeutic activities;Patient/family education;Manual techniques;Manual lymph drainage;Compression bandaging;Taping;Vasopneumatic Device    Consulted and Agree with Plan of Care Patient             Patient will benefit from skilled therapeutic intervention in order to improve the following deficits  and impairments:     Visit Diagnosis: Lymphedema, not elsewhere classified     Problem List Patient Active Problem List   Diagnosis Date Noted   Left leg swelling 03/11/2021   Long term current use of hormonal contraceptive 05/26/2020   Encounter for preventive health examination 05/15/2019   Iron deficiency anemia 09/12/2018   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 [place], [place], [address] Phone: [phone]   Fax:  [phone]  Name: Bethlehem Langstaff MRN: [id number] Date of Birth: Oct 21, 1981

## 2021-06-22 ENCOUNTER — Encounter: Payer: Self-pay | Admitting: Physical Therapy

## 2021-06-22 ENCOUNTER — Other Ambulatory Visit: Payer: Self-pay

## 2021-06-22 ENCOUNTER — Ambulatory Visit: Payer: 59 | Admitting: Physical Therapy

## 2021-06-22 DIAGNOSIS — I890 Lymphedema, not elsewhere classified: Secondary | ICD-10-CM

## 2021-06-22 NOTE — Therapy (Signed)
[facility] [address] [place], [place], [address] Phone: [phone]   Fax:  [phone]  Physical Therapy Treatment  Patient Details  Name: Jennifer Mcdowell MRN: [id number] Date of Birth: [birth year] Referring Provider (PT): Felix Pacini   Encounter Date: 06/22/2021   PT End of Session - 06/22/21 0956     Visit Number 11    Number of Visits 14    Date for PT Re-Evaluation 09/22/21    PT Start Time 0904    PT Stop Time 0956    PT Time Calculation (min) 52 min    Activity Tolerance Patient tolerated treatment well    Behavior During Therapy [facility] for tasks assessed/performed             Past Medical History:  Diagnosis Date   Gestational diabetes     Past Surgical History:  Procedure Laterality Date   NO PAST SURGERIES      There were no vitals filed for this visit.   Subjective Assessment - 06/22/21 0907     Subjective I have not gotten the new garment yet. It should be shipping anyday. My swelling has been about the same. It only swells at the ankle.    Pertinent History anemia    Patient Stated Goals to get the swelling to go away    Currently in Pain? No/denies                   LYMPHEDEMA/ONCOLOGY QUESTIONNAIRE - 06/22/21 0001       Left Lower Extremity Lymphedema   10 cm Proximal to Floor at Lateral Malleoli 20 cm    5 cm Proximal to 1st MTP Joint 18.7 cm    Across MTP Joint 19.3 cm    Around Proximal Great Toe 6.8 cm                Flowsheet Row Outpatient Rehab from 05/05/2021 [facility]  Lymphedema Life Impact Scale Total Score 19.12 %             [facility] Adult PT Treatment/Exercise - 06/22/21 0001       Manual Therapy   Edema Management remeasured circumferences    Manual Lymphatic Drainage (MLD) short neck, 5 diaphragmatic breaths, left axillary nodes and establishment of inguinal axillo pathway,right inguinal nodes  and anterior interinguinal anastamosis left inguinal nodes, LLE working proximal to distal then retracing all steps with extra time spent on ankle where pt is most swollen                         PT Long Term Goals - 06/22/21 0955       PT LONG TERM GOAL #1   Title Pt will obtain appropriate compression garment for long term management of lymphedema.    Baseline 06/22/21- pt is still awaiting class 2 flat knit compression garment    Time 4    Period Weeks    Status On-going      PT LONG TERM GOAL #2   Title Pt will demonstrate a 1 cm decrease in edema 10 cm proximal to floor at lateral plantar foot to allow improved comfort    Baseline 21.4; 06/23/19- 20 cm    Time 4    Period Weeks    Status Achieved      PT LONG TERM GOAL #3   Title Pt will be able to indepently manage her lymphedema thorugh self MLD and  compression garments    Time 4    Period Weeks    Status On-going                   Plan - 06/22/21 0957     Clinical Impression Statement Pt returns to PT after attempting self management for a month. Her foot swelling is down but she continues to demosntrate ankle swelling at medial and lateral malleoli. She is still awaiting her class 2 flat knit compression stocking and her compression pump. Continued with MLD today with focus at medial and lateral malleoli. Pt would benefit from continued skilled PT services to see how pt is doing in a month after using the compression pump and receiving her new compression stocking.    PT Frequency Monthy    PT Duration 12 weeks    PT Treatment/Interventions ADLs/Self Care Home Management;Therapeutic exercise;Therapeutic activities;Patient/family education;Manual techniques;Manual lymph drainage;Compression bandaging;Taping;Vasopneumatic Device    PT Next Visit Plan did pt receive class 2 garment, how is pump?    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 [place], [place], [address] Phone: [phone]   Fax:  [phone]  Name: Jennifer Mcdowell MRN: [id number] Date of Birth: [birth year]  Leonette Most, PT 06/22/21 10:02 AM

## 2021-06-24 DIAGNOSIS — Z20822 Contact with and (suspected) exposure to covid-19: Secondary | ICD-10-CM | POA: Diagnosis not present

## 2021-07-19 ENCOUNTER — Other Ambulatory Visit: Payer: Self-pay

## 2021-07-19 ENCOUNTER — Other Ambulatory Visit: Payer: Self-pay | Admitting: Family Medicine

## 2021-07-19 ENCOUNTER — Other Ambulatory Visit (HOSPITAL_COMMUNITY): Payer: Self-pay

## 2021-07-19 ENCOUNTER — Telehealth: Payer: Self-pay | Admitting: Family Medicine

## 2021-07-19 DIAGNOSIS — Z1231 Encounter for screening mammogram for malignant neoplasm of breast: Secondary | ICD-10-CM

## 2021-07-19 MED ORDER — POLYSACCHARIDE IRON COMPLEX 150 MG PO CAPS
150.0000 mg | ORAL_CAPSULE | ORAL | 0 refills | Status: DC
  Filled 2021-07-19: qty 15, 30d supply, fill #0

## 2021-07-19 MED FILL — Etonogestrel-Ethinyl Estradiol VA Ring 0.12-0.015 MG/24HR: VAGINAL | 84 days supply | Qty: 3 | Fill #1 | Status: AC

## 2021-07-19 NOTE — Telephone Encounter (Signed)
30 d/s sent 

## 2021-07-19 NOTE — Telephone Encounter (Signed)
Patient requesting refill of her iron prescription. Please send to same [facility].

## 2021-07-19 NOTE — Telephone Encounter (Signed)
Pt needs appt with PCP before further refills

## 2021-07-23 ENCOUNTER — Ambulatory Visit
Admission: RE | Admit: 2021-07-23 | Discharge: 2021-07-23 | Disposition: A | Discharge status: Home / Self Care | Payer: 59 | Source: Ambulatory Visit | Attending: Family Medicine | Admitting: Family Medicine

## 2021-07-23 ENCOUNTER — Other Ambulatory Visit: Payer: Self-pay

## 2021-07-23 DIAGNOSIS — Z1231 Encounter for screening mammogram for malignant neoplasm of breast: Secondary | ICD-10-CM

## 2021-08-02 ENCOUNTER — Ambulatory Visit: Payer: 59 | Attending: Family Medicine | Admitting: Physical Therapy

## 2021-08-02 ENCOUNTER — Encounter: Payer: Self-pay | Admitting: Physical Therapy

## 2021-08-02 ENCOUNTER — Other Ambulatory Visit: Payer: Self-pay

## 2021-08-02 DIAGNOSIS — I890 Lymphedema, not elsewhere classified: Secondary | ICD-10-CM | POA: Diagnosis not present

## 2021-08-02 NOTE — Therapy (Signed)
[facility] [address] [place], [place], [address] Phone: [phone]   Fax:  [phone]  Physical Therapy Treatment  Patient Details  Name: Jennifer Mcdowell MRN: [id number] Date of Birth: 04-10-1981 Referring Provider (PT): Felix Pacini   Encounter Date: 08/02/2021   PT End of Session - 08/02/21 1054     Visit Number 12    Number of Visits 14    Date for PT Re-Evaluation 09/22/21    PT Start Time 1006    PT Stop Time 1053    PT Time Calculation (min) 47 min    Activity Tolerance Patient tolerated treatment well    Behavior During Therapy [facility] for tasks assessed/performed             Past Medical History:  Diagnosis Date   Gestational diabetes     Past Surgical History:  Procedure Laterality Date   NO PAST SURGERIES      There were no vitals filed for this visit.   Subjective Assessment - 08/02/21 1007     Subjective I got the new garment but I can not tell any difference. It is still right at the ankle. I have a night time garment. I think that helps a lot. The swelling really goes down by the time I wake up.    Pertinent History anemia    Patient Stated Goals to get the swelling to go away    Currently in Pain? No/denies    Multiple Pain Sites No                   LYMPHEDEMA/ONCOLOGY QUESTIONNAIRE - 08/02/21 0001       Left Lower Extremity Lymphedema   10 cm Proximal to Floor at Lateral Malleoli 21 cm    5 cm Proximal to 1st MTP Joint 19.3 cm    Across MTP Joint 20.2 cm    Around Proximal Great Toe 7 cm                Flowsheet Row Outpatient Rehab from 05/05/2021 [facility]  Lymphedema Life Impact Scale Total Score 19.12 %             [facility] Adult PT Treatment/Exercise - 08/02/21 0001       Manual Therapy   Edema Management remeasured circumferences    Manual Lymphatic Drainage (MLD) short neck, 5 diaphragmatic  breaths, left axillary nodes and establishment of inguinal axillo pathway,right inguinal nodes and anterior interinguinal anastamosis left inguinal nodes, LLE working proximal to distal then retracing all steps with extra time spent on ankle where pt is most swollen                         PT Long Term Goals - 06/22/21 0955       PT LONG TERM GOAL #1   Title Pt will obtain appropriate compression garment for long term management of lymphedema.    Baseline 06/22/21- pt is still awaiting class 2 flat knit compression garment    Time 4    Period Weeks    Status On-going      PT LONG TERM GOAL #2   Title Pt will demonstrate a 1 cm decrease in edema 10 cm proximal to floor at lateral plantar foot to allow improved comfort    Baseline 21.4; 06/23/19- 20 cm    Time 4    Period Weeks    Status Achieved  PT LONG TERM GOAL #3   Title Pt will be able to indepently manage her lymphedema thorugh self MLD and compression garments    Time 4    Period Weeks    Status On-going                   Plan - 08/02/21 1055     Clinical Impression Statement Pt returns to appointment after attempting to manage her lymphedema independently over the last month with her compression garments. She sleeps in her night time compression garment every night and wears her day time compression garment consistently. She reports when she wakes her her swelling has decreased but as soon as she begins moving around her ankle begins to swell. When she removed her flat knit compression stocking this morning she had visible swelling at her medial and lateral malleoli. Pt would really benefit from a FlexiTouch compression pump to help manage her swelling as lately the swelling has also begun to cause discomfort in her ankles when driving long distances. She has attempted elevation, compression garments, and exercise. Despite all of this her ankle continues to swell. Her circumferential measurements taken  today compared to a month ago have increased at least a cm throughout despite pt consistently wearing her compression.    PT Frequency Monthy    PT Duration 12 weeks    PT Treatment/Interventions ADLs/Self Care Home Management;Therapeutic exercise;Therapeutic activities;Patient/family education;Manual techniques;Manual lymph drainage;Compression bandaging;Taping;Vasopneumatic Device    PT Next Visit Plan was pt able to get pump, how is ankle?    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

## 2021-09-09 ENCOUNTER — Other Ambulatory Visit (HOSPITAL_COMMUNITY): Payer: Self-pay

## 2021-09-09 MED ORDER — AMOXICILLIN-POT CLAVULANATE 875-125 MG PO TABS
ORAL_TABLET | ORAL | 0 refills | Status: DC
  Filled 2021-09-09: qty 14, 7d supply, fill #0

## 2021-09-22 ENCOUNTER — Encounter: Payer: Self-pay | Admitting: Family Medicine

## 2021-09-22 ENCOUNTER — Other Ambulatory Visit (HOSPITAL_COMMUNITY): Payer: Self-pay

## 2021-09-22 ENCOUNTER — Other Ambulatory Visit: Payer: Self-pay

## 2021-09-22 ENCOUNTER — Ambulatory Visit: Payer: 59 | Admitting: Family Medicine

## 2021-09-22 VITALS — BP 129/86 | HR 67 | Temp 98.8°F | Wt 121.6 lb

## 2021-09-22 DIAGNOSIS — I890 Lymphedema, not elsewhere classified: Secondary | ICD-10-CM | POA: Diagnosis not present

## 2021-09-22 DIAGNOSIS — Z23 Encounter for immunization: Secondary | ICD-10-CM | POA: Diagnosis not present

## 2021-09-22 DIAGNOSIS — D509 Iron deficiency anemia, unspecified: Secondary | ICD-10-CM

## 2021-09-22 MED ORDER — POLYSACCHARIDE IRON COMPLEX 150 MG PO CAPS
150.0000 mg | ORAL_CAPSULE | ORAL | 3 refills | Status: DC
  Filled 2021-09-22: qty 45, 90d supply, fill #0
  Filled 2022-01-04: qty 45, 90d supply, fill #1
  Filled 2022-04-27: qty 45, 90d supply, fill #2
  Filled 2022-06-27 – 2022-08-23 (×2): qty 45, 90d supply, fill #3

## 2021-09-22 NOTE — Progress Notes (Signed)
This visit occurred during the SARS-CoV-2 public health emergency.  Safety protocols were in place, including screening questions prior to the visit, additional usage of staff PPE, and extensive cleaning of exam room while observing appropriate contact time as indicated for disinfecting solutions.    Jennifer Mcdowell [place] , [birth year], 40 y.o., female MRN: [id number] Patient Care Team    Relationship Specialty Notifications Start End  Natalia Leatherwood, DO PCP - General Family Medicine  03/16/18   Silverio Lay, MD Consulting Physician Obstetrics and Gynecology  03/16/18     Chief Complaint  Patient presents with   Follow-up    iron     Subjective: Jennifer Mcdowell is 40 y.o. female Pt chronic medical conditions.  Iron deficiency:  Patient reports she is feeling well she has been taking her Niferex 150 iron supplementation daily.  She is here for repeat labs today to monitor therapy.  She will need refills if therapy is continued.  Lymphedema: Patient has been following up with lymphedema the clinic at [facility].  They are working with her to obtain approval for flex E touch pneumatic compression device secondary to continued lymphedema despite conservative treatments, compression, elevation, exercise, physical therapy.  She has tried compression stockings and static pressure devices. Prior note OV to follow-up on her left lower extremity lymphedema, now has been present since approximately January 2022.  She has had a normal vascular ultrasound study of her lower extremities.  CT abdomen pelvis was completed to rule out pelvic congestion, pelvic etiology with her reports of occasional sharp pain in the left lower abdomen, or blockage to lymph drainage from the pelvic/inguinal region-this was also normal.  She has had a normal CMP, with only a mild lower and calcium at 8.5.  CBC normal.  TSH normal. She returns today to discuss her lymphedema.  She has been wearing  compression stockings at times, and she has been keeping her feet uncrossed at work and keeping her leg elevated when possible.  The lymphedema seems to continue and is causing her discomfort especially with her shoes and around her ankle.  Prior note: With complaints of unilateral/left lower extremity swelling for the past 3 months.  She states the swelling is present every day.  It is worse in the evening, but does not always completely resolve by morning.  She states when the swelling is present she can sometimes have a tingling sensation in her large toe.  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  She reports by the end of the day she can push on her left lower extremity and now will be " dents."  She reports she sometimes sits with her ankles crossed, but not all the time.  She denies any fever, chills, cough, shortness of breath, or leg pain.  She does endorse an occasional abdominal discomfort that has occurred just 2-3 times over the course of the last few months in her left lower abdominal quadrant.  She states she will have a very quick sharp pain in that location.  She reports her bowel movements have not changed.  She denies any heavy lifting or injury.  She denies fever or chills. Another provider had ordered a left lower extremity venous duplex which ruled out DVT. She denies any injury or past surgeries involving her left lower extremity.  She has no personal or family history of blood clots.  Vascular ultrasound 03/08/2021: RIGHT:  - No evidence  of common femoral vein obstruction.  LEFT:  - There is no evidence of deep vein thrombosis in the lower extremity.  - No cystic structure found in the popliteal fossa.  CT abdomen pelvis 03/11/2021: IMPRESSION: 1. Hepatomegaly and hepatic steatosis. 2. Otherwise no acute intra-abdominal or intrapelvic abnormality.  Depression screen [place] [facility] 2/9 01/06/2021 05/26/2020 05/15/2019 03/16/2018  Decreased Interest 0 0 0 0   Down, Depressed, Hopeless 0 0 0 0  PHQ - 2 Score 0 0 0 0    No Known Allergies Social History   Social History Narrative   Not on file   Past Medical History:  Diagnosis Date   Gestational diabetes    Past Surgical History:  Procedure Laterality Date   NO PAST SURGERIES     Family History  Problem Relation Age of Onset   Hypertension Father    Breast cancer Maternal Grandmother 55       passed away at 85 in stage 4 at diagnosis.    Allergies as of 09/22/2021   No Known Allergies      Medication List        Accurate as of September 22, 2021  1:04 PM. If you have any questions, ask your nurse or doctor.          amoxicillin-clavulanate 875-125 MG tablet Commonly known as: AUGMENTIN Take 1 tablet by mouth 2 times a day until gone   doxycycline 100 MG tablet Commonly known as: VIBRA-TABS Take 1 tablet (100 mg total) by mouth 2 (two) times daily.   etonogestrel-ethinyl estradiol 0.12-0.015 MG/24HR vaginal ring Commonly known as: NUVARING INSERT 1 RING VAGINALLY ONCE A MONTH   Ferrex 150 150 MG capsule Generic drug: iron polysaccharides Take 1 capsule  by mouth every other day.   MULTIVITAMIN ADULT PO Take by mouth.   Myorisan 30 MG capsule Generic drug: ISOtretinoin Take 60 mg by mouth daily.   tretinoin 0.025 % cream Commonly known as: RETIN-A Apply a dime-size amount onto the entire face as tolerated at bedtime (Apply a dime-size amount onto the entire face as tolerated at bedtime)   triamcinolone ointment 0.1 % Commonly known as: KENALOG Apply topically.   triamcinolone ointment 0.1 % Commonly known as: KENALOG APPLY TOPICALLY ONTO THE SKIN 2 TIMES DAILY TAPER USE AS ABLE        All past medical history, surgical history, allergies, family history, immunizations andmedications were updated in the EMR today and reviewed under the history and medication portions of their EMR.     ROS: Negative, with the exception of above mentioned in  HPI   Objective:  BP 129/86   Pulse 67   Temp 98.8 F (37.1 C)   Wt 121 lb 9.6 oz (55.2 kg)   LMP 09/15/2021   SpO2 100%   BMI 20.87 kg/m  Body mass index is 20.87 kg/m. Gen: Afebrile. No acute distress.  Nontoxic, very pleasant female. HENT: AT. [place].  No cough.  No hoarseness. Eyes:Pupils Equal Round Reactive to light, Extraocular movements intact,  Conjunctiva without redness, discharge or icterus. CV: RRR  Chest: CTAB, no wheeze or crackles MSK: Left lower extremity positive for chronic lymphedema.  Compression stocking in place. Skin: No rashes, purpura or petechiae.  Neuro: Normal gait. PERLA. EOMi. Alert. Oriented x3 Psych: Normal affect, dress and demeanor. Normal speech. Normal thought content and judgment..    No results found. No results found. No results found for this or any previous visit (from the past 24 hour(s)).  Assessment/Plan: Marcine Matar  Stellarose Cerny is a 40 y.o. female present for OV for  Lymphedema: Fortunately despite exhausting conservative treatments and physical therapy, she continues to have chronic lymphedema of her left lower extremity. We will draft a letter and attempts to help her obtain coverage for a flexi-touch pneumatic compression device which hopefully will provide her a better treatment plan for her lymphedema.  Iron deficiency: Iron panel collected today Patient will be guided on iron supplementation dose after panel completed.  Influenza vaccination provided today Reviewed expectations re: course of current medical issues. Discussed self-management of symptoms. Outlined signs and symptoms indicating need for more acute intervention. Patient verbalized understanding and all questions were answered. Patient received an After-Visit Summary.    Orders Placed This Encounter  Procedures   Flu Vaccine QUAD 65mo+IM (Fluarix, Fluzone & Alfiuria Quad PF)   No orders of the defined types were placed in this encounter.  Referral  Orders  No referral(s) requested today     Note is dictated utilizing voice recognition software. Although note has been proof read prior to signing, occasional typographical errors still can be missed. If any questions arise, please do not hesitate to call for verification.   electronically signed by:  Felix Pacini, DO   Primary Care - [place]

## 2021-09-22 NOTE — Patient Instructions (Signed)
Great to see you today.  I have refilled the medication(s) we provide.   If labs were collected, we will inform you of lab results once received either by echart message or telephone call.   - echart message- for normal results that have been seen by the patient already.   - telephone call: abnormal results or if patient has not viewed results in their echart.  

## 2021-09-23 ENCOUNTER — Telehealth: Payer: Self-pay

## 2021-09-23 LAB — IRON,TIBC AND FERRITIN PANEL
%SAT: 34 % (calc) (ref 16–45)
Ferritin: 10 ng/mL — ABNORMAL LOW (ref 16–154)
Iron: 158 ug/dL (ref 40–190)
TIBC: 469 mcg/dL (calc) — ABNORMAL HIGH (ref 250–450)

## 2021-09-23 NOTE — Telephone Encounter (Signed)
Fax already sent. Christine aware.

## 2021-09-23 NOTE — Telephone Encounter (Signed)
Christine calling from [employer] regarding a form she faxed regarding medical necessity. Dr. Claiborne Billings was to sign, and fax back.  Wynona Canes can be reached at [phone].

## 2021-10-13 ENCOUNTER — Other Ambulatory Visit (HOSPITAL_COMMUNITY): Payer: Self-pay

## 2021-10-13 MED ORDER — LEVOFLOXACIN 500 MG PO TABS
500.0000 mg | ORAL_TABLET | Freq: Every day | ORAL | 0 refills | Status: DC
  Filled 2021-10-13: qty 10, 10d supply, fill #0

## 2021-10-14 ENCOUNTER — Other Ambulatory Visit (HOSPITAL_COMMUNITY): Payer: Self-pay

## 2021-10-14 ENCOUNTER — Other Ambulatory Visit: Payer: Self-pay

## 2021-10-15 ENCOUNTER — Other Ambulatory Visit (HOSPITAL_COMMUNITY): Payer: Self-pay

## 2021-10-15 MED ORDER — ETONOGESTREL-ETHINYL ESTRADIOL 0.12-0.015 MG/24HR VA RING
VAGINAL_RING | VAGINAL | 1 refills | Status: DC
  Filled 2021-10-15: qty 3, 84d supply, fill #0
  Filled 2022-01-04: qty 3, 84d supply, fill #1

## 2021-11-22 ENCOUNTER — Other Ambulatory Visit (HOSPITAL_COMMUNITY): Payer: Self-pay

## 2021-11-22 MED ORDER — FLUCONAZOLE 100 MG PO TABS
100.0000 mg | ORAL_TABLET | Freq: Every day | ORAL | 1 refills | Status: DC
  Filled 2021-11-22: qty 7, 7d supply, fill #0

## 2022-01-05 ENCOUNTER — Other Ambulatory Visit (HOSPITAL_COMMUNITY): Payer: Self-pay

## 2022-01-17 ENCOUNTER — Telehealth (HOSPITAL_BASED_OUTPATIENT_CLINIC_OR_DEPARTMENT_OTHER): Payer: Self-pay | Admitting: Obstetrics & Gynecology

## 2022-01-17 NOTE — Telephone Encounter (Signed)
Called patient and left a message to pleases call the office back to set  up new gyn appointment .

## 2022-02-03 ENCOUNTER — Encounter (HOSPITAL_BASED_OUTPATIENT_CLINIC_OR_DEPARTMENT_OTHER): Payer: Self-pay | Admitting: Obstetrics & Gynecology

## 2022-02-03 ENCOUNTER — Other Ambulatory Visit: Payer: Self-pay

## 2022-02-03 ENCOUNTER — Other Ambulatory Visit (HOSPITAL_COMMUNITY): Payer: Self-pay

## 2022-02-03 ENCOUNTER — Ambulatory Visit (HOSPITAL_BASED_OUTPATIENT_CLINIC_OR_DEPARTMENT_OTHER): Payer: 59 | Admitting: Obstetrics & Gynecology

## 2022-02-03 VITALS — BP 128/86 | HR 58 | Ht 63.0 in | Wt 121.6 lb

## 2022-02-03 DIAGNOSIS — Z3044 Encounter for surveillance of vaginal ring hormonal contraceptive device: Secondary | ICD-10-CM | POA: Diagnosis not present

## 2022-02-03 DIAGNOSIS — D509 Iron deficiency anemia, unspecified: Secondary | ICD-10-CM | POA: Diagnosis not present

## 2022-02-03 DIAGNOSIS — K760 Fatty (change of) liver, not elsewhere classified: Secondary | ICD-10-CM

## 2022-02-03 DIAGNOSIS — Z01419 Encounter for gynecological examination (general) (routine) without abnormal findings: Secondary | ICD-10-CM

## 2022-02-03 DIAGNOSIS — I890 Lymphedema, not elsewhere classified: Secondary | ICD-10-CM

## 2022-02-03 MED ORDER — ETONOGESTREL-ETHINYL ESTRADIOL 0.12-0.015 MG/24HR VA RING
VAGINAL_RING | VAGINAL | 4 refills | Status: DC
  Filled 2022-02-03: qty 3, fill #0
  Filled 2022-04-03: qty 3, 84d supply, fill #0
  Filled 2022-06-27: qty 3, 84d supply, fill #1
  Filled 2022-08-23 – 2022-09-15 (×3): qty 3, 84d supply, fill #2
  Filled 2022-11-13 – 2022-12-05 (×2): qty 3, 84d supply, fill #3

## 2022-02-03 NOTE — Progress Notes (Signed)
41 y.o. G35P2012 Married [place] female here for annual new exam.  She is a patient of Dr. Cloretta Ned.  Cycles are regular.  Flow lasts 5-7 days but only 3 of these are regular flow.  Uses nuva ring for contraception.   ? ?Patient's last menstrual period was 02/02/2022 (exact date).          ?Sexually active: Yes.    ?The current method of family planning is NuvaRing vaginal inserts.    ? Up[facility] - 02/03/22 1158   ? ?  ? Pregnancy Intention Screening  ? Does the patient want to become pregnant in the next year? No   ? Does the patient's partner want to become pregnant in the next year? No   ? Would the patient like to discuss contraceptive options today? No   ?  ? Contraception Wrap Up  ? Current Method Vaginal Ring   ? ?  ?  ? ?  ? ?Exercising: Yes.     treadmill ?Smoker:  no ? ?Health Maintenance: ?Pap:  about two years ago ?History of abnormal Pap:  no ?MMG:  07/2021 ?Colonoscopy:  guidelines for colon cancer screening reviewed ? ? reports that she has never smoked. She has never used smokeless tobacco. She reports current alcohol use of about 1.0 standard drink per week. She reports that she does not use drugs. ? ?Past Medical History:  ?Diagnosis Date  ? Gestational diabetes   ? Lymphedema   ? left leg  ? ? ?Past Surgical History:  ?Procedure Laterality Date  ? NO PAST SURGERIES    ? ? ?Current Outpatient Medications  ?Medication Sig Dispense Refill  ? iron polysaccharides (NIFEREX) 150 MG capsule Take 1 capsule  by mouth every other day. 90 capsule 3  ? MYORISAN 30 MG capsule Take 30 mg by mouth daily.    ? etonogestrel-ethinyl estradiol (NUVARING) 0.12-0.015 MG/24HR vaginal ring Insert 1 ring vaginally once a month as directed. 3 each 4  ? Multiple Vitamin (MULTIVITAMIN ADULT PO) Take by mouth. (Patient not taking: Reported on 02/03/2022)    ? ?No current facility-administered medications for this visit.  ? ? ?Family History  ?Problem Relation Age of Onset  ? Hypertension Mother   ? Hypertension Father   ? Breast  cancer Maternal Grandmother 55  ?     passed away at 55 in stage 4 at diagnosis.   ? Hypertension Maternal Grandfather   ? Cancer Paternal Grandmother   ? ? ?Review of Systems  ?All other systems reviewed and are negative. ? ?Exam:   ?BP 128/86   Pulse (!) 58   Ht 5\' 3"  (1.6 m)   Wt 121 lb 9.6 oz (55.2 kg)   LMP 02/02/2022 (Exact Date)   BMI 21.54 kg/m?   Height: 5\' 3"  (160 cm) ? ?General appearance: alert, cooperative and appears stated age ?Head: Normocephalic, without obvious abnormality, atraumatic ?Neck: no adenopathy, supple, symmetrical, trachea midline and thyroid normal to inspection and palpation ?Lungs: clear to auscultation bilaterally ?Breasts: normal appearance, no masses or tenderness ?Heart: regular rate and rhythm ?Abdomen: soft, non-tender; bowel sounds normal; no masses,  no organomegaly ?Extremities: extremities normal, atraumatic, no cyanosis or edema ?Skin: Skin color, texture, turgor normal. No rashes or lesions ?Lymph nodes: Cervical, supraclavicular, and axillary nodes normal. ?No abnormal inguinal nodes palpated ?Neurologic: Grossly normal ? ? ?Pelvic: Declined pelvic exam today as is on her cycle ? ?Assessment/Plan: ?1. Well woman exam with routine gynecological exam ?- will return for pelvic exam.  Declines  have this done while on menstrual cycle ?- MMG 07/2021 ?- colon cancer screening guidelines reviewed ?- vaccines updated ?- lab work done with Dr. Claiborne Billings ? ?2. Encounter for surveillance of vaginal ring hormonal contraceptive device ?- etonogestrel-ethinyl estradiol (NUVARING) 0.12-0.015 MG/24HR vaginal ring; Insert 1 ring vaginally once a month as directed.  Dispense: 3 each; Refill:  ? ?3. Lymphedema ?- uses compression stocking on LLE.  Has been seen by PT. ? ?4. Iron deficiency anemia, unspecified iron deficiency anemia type ?- on oral iron ? ?

## 2022-02-04 ENCOUNTER — Encounter (HOSPITAL_BASED_OUTPATIENT_CLINIC_OR_DEPARTMENT_OTHER): Payer: Self-pay | Admitting: Obstetrics & Gynecology

## 2022-02-04 NOTE — Addendum Note (Signed)
Addended by: Jerene Bears on: 02/04/2022 05:46 AM ? ? Modules accepted: Level of Service ? ?

## 2022-02-11 ENCOUNTER — Other Ambulatory Visit (HOSPITAL_COMMUNITY)
Admission: RE | Admit: 2022-02-11 | Discharge: 2022-02-11 | Disposition: A | Discharge status: Home / Self Care | Payer: 59 | Source: Ambulatory Visit | Attending: Obstetrics & Gynecology | Admitting: Obstetrics & Gynecology

## 2022-02-11 ENCOUNTER — Other Ambulatory Visit: Payer: Self-pay

## 2022-02-11 ENCOUNTER — Ambulatory Visit (INDEPENDENT_AMBULATORY_CARE_PROVIDER_SITE_OTHER): Payer: Self-pay | Admitting: Obstetrics & Gynecology

## 2022-02-11 ENCOUNTER — Encounter (HOSPITAL_BASED_OUTPATIENT_CLINIC_OR_DEPARTMENT_OTHER): Payer: Self-pay | Admitting: Obstetrics & Gynecology

## 2022-02-11 VITALS — BP 113/83 | HR 68 | Wt 121.8 lb

## 2022-02-11 DIAGNOSIS — Z124 Encounter for screening for malignant neoplasm of cervix: Secondary | ICD-10-CM

## 2022-02-14 NOTE — Progress Notes (Signed)
41 y.o. G30P2012 Married [place] female here for completion of pelvic exam.  Pt was seen as new patient last week and was on her menstrual cycle.  Declined pelvic exam and pap smear then.  Is back for this today. ? ?Exam:   ?BP 113/83 (BP Location: Left Arm, Patient Position: Sitting, Cuff Size: Normal)   Pulse 68   Wt 121 lb 12.8 oz (55.2 kg)   LMP 02/02/2022 (Exact Date)   BMI 21.58 kg/m?     ? ?General appearance: alert, cooperative and appears stated age ?Lymph nodes: No abnormal inguinal nodes palpated ? ?Pelvic: External genitalia:  no lesions ?             Urethra:  normal appearing urethra with no masses, tenderness or lesions ?             Bartholins and Skenes: normal    ?             Vagina: normal appearing vagina with normal color and no discharge, no lesions ?             Cervix: no lesions ?             Pap taken: Yes.   ?Bimanual Exam:  Uterus:  normal size, contour, position, consistency, mobility, non-tender ?             Adnexa: no mass, fullness, tenderness ?              Rectovaginal: Confirms ?              Anus:  normal sphincter tone, no lesions ? ?Chaperone, Ina Homes, CMA, was present for exam. ? ?Assessment/Plan: ?1. Cervical cancer screening ?- pelvic exam normal today ?- Cytology - PAP( [facility]) ? ? ?

## 2022-02-15 LAB — CYTOLOGY - PAP
Comment: NEGATIVE
Diagnosis: NEGATIVE
High risk HPV: NEGATIVE

## 2022-03-02 DIAGNOSIS — H52203 Unspecified astigmatism, bilateral: Secondary | ICD-10-CM | POA: Diagnosis not present

## 2022-03-02 DIAGNOSIS — H5213 Myopia, bilateral: Secondary | ICD-10-CM | POA: Diagnosis not present

## 2022-04-04 ENCOUNTER — Other Ambulatory Visit (HOSPITAL_COMMUNITY): Payer: Self-pay

## 2022-04-11 DIAGNOSIS — L700 Acne vulgaris: Secondary | ICD-10-CM | POA: Diagnosis not present

## 2022-04-27 ENCOUNTER — Other Ambulatory Visit (HOSPITAL_COMMUNITY): Payer: Self-pay

## 2022-06-27 ENCOUNTER — Other Ambulatory Visit (HOSPITAL_COMMUNITY): Payer: Self-pay

## 2022-06-27 MED ORDER — AMOXICILLIN-POT CLAVULANATE 875-125 MG PO TABS
ORAL_TABLET | ORAL | 0 refills | Status: DC
  Filled 2022-06-27: qty 14, 7d supply, fill #0

## 2022-08-23 ENCOUNTER — Other Ambulatory Visit: Payer: Self-pay | Admitting: Family Medicine

## 2022-08-23 ENCOUNTER — Other Ambulatory Visit (HOSPITAL_COMMUNITY): Payer: Self-pay

## 2022-08-23 DIAGNOSIS — Z1231 Encounter for screening mammogram for malignant neoplasm of breast: Secondary | ICD-10-CM

## 2022-09-15 ENCOUNTER — Other Ambulatory Visit (HOSPITAL_COMMUNITY): Payer: Self-pay

## 2022-09-22 ENCOUNTER — Ambulatory Visit
Admission: RE | Admit: 2022-09-22 | Discharge: 2022-09-22 | Disposition: A | Discharge status: Home / Self Care | Payer: 59 | Source: Ambulatory Visit | Attending: Family Medicine | Admitting: Family Medicine

## 2022-09-22 DIAGNOSIS — Z1231 Encounter for screening mammogram for malignant neoplasm of breast: Secondary | ICD-10-CM

## 2022-10-25 ENCOUNTER — Ambulatory Visit: Payer: 59 | Admitting: Family Medicine

## 2022-10-25 ENCOUNTER — Encounter: Payer: Self-pay | Admitting: Family Medicine

## 2022-10-25 VITALS — BP 129/65 | HR 59 | Temp 98.6°F | Wt 120.8 lb

## 2022-10-25 DIAGNOSIS — R42 Dizziness and giddiness: Secondary | ICD-10-CM | POA: Diagnosis not present

## 2022-10-25 DIAGNOSIS — Z23 Encounter for immunization: Secondary | ICD-10-CM | POA: Diagnosis not present

## 2022-10-25 DIAGNOSIS — D509 Iron deficiency anemia, unspecified: Secondary | ICD-10-CM | POA: Diagnosis not present

## 2022-10-25 LAB — CBC WITH DIFFERENTIAL/PLATELET
Basophils Absolute: 0 10*3/uL (ref 0.0–0.1)
Basophils Relative: 0.4 % (ref 0.0–3.0)
Eosinophils Absolute: 0.1 10*3/uL (ref 0.0–0.7)
Eosinophils Relative: 1.3 % (ref 0.0–5.0)
HCT: 39.4 % (ref 36.0–46.0)
Hemoglobin: 13.1 g/dL (ref 12.0–15.0)
Lymphocytes Relative: 37.3 % (ref 12.0–46.0)
Lymphs Abs: 2 10*3/uL (ref 0.7–4.0)
MCHC: 33.2 g/dL (ref 30.0–36.0)
MCV: 79.9 fl (ref 78.0–100.0)
Monocytes Absolute: 0.5 10*3/uL (ref 0.1–1.0)
Monocytes Relative: 10.2 % (ref 3.0–12.0)
Neutro Abs: 2.7 10*3/uL (ref 1.4–7.7)
Neutrophils Relative %: 50.8 % (ref 43.0–77.0)
Platelets: 298 10*3/uL (ref 150.0–400.0)
RBC: 4.93 Mil/uL (ref 3.87–5.11)
RDW: 12.5 % (ref 11.5–15.5)
WBC: 5.3 10*3/uL (ref 4.0–10.5)

## 2022-10-25 LAB — IBC + FERRITIN
Ferritin: 18 ng/mL (ref 10.0–291.0)
Iron: 137 ug/dL (ref 42–145)
Saturation Ratios: 31.8 % (ref 20.0–50.0)
TIBC: 431.2 ug/dL (ref 250.0–450.0)
Transferrin: 308 mg/dL (ref 212.0–360.0)

## 2022-10-25 LAB — COMPREHENSIVE METABOLIC PANEL
ALT: 7 U/L (ref 0–35)
AST: 13 U/L (ref 0–37)
Albumin: 3.9 g/dL (ref 3.5–5.2)
Alkaline Phosphatase: 46 U/L (ref 39–117)
BUN: 9 mg/dL (ref 6–23)
CO2: 27 mEq/L (ref 19–32)
Calcium: 8.5 mg/dL (ref 8.4–10.5)
Chloride: 103 mEq/L (ref 96–112)
Creatinine, Ser: 0.6 mg/dL (ref 0.40–1.20)
GFR: 111.49 mL/min (ref >60.00)
Glucose, Bld: 70 mg/dL (ref 70–99)
Potassium: 4 mEq/L (ref 3.5–5.1)
Sodium: 136 mEq/L (ref 135–145)
Total Bilirubin: 0.5 mg/dL (ref 0.2–1.2)
Total Protein: 6.7 g/dL (ref 6.0–8.3)

## 2022-10-25 LAB — TSH: TSH: 4.21 u[IU]/mL (ref 0.35–5.50)

## 2022-10-25 LAB — VITAMIN B12: Vitamin B-12: 251 pg/mL (ref 211–911)
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
## 2022-10-25 NOTE — Patient Instructions (Addendum)
Return in about 2 weeks (around 11/08/2022), or if symptoms worsen or fail to improve.        Great to see you today.  I have refilled the medication(s) we provide.   If labs were collected, we will inform you of lab results once received either by echart message or telephone call.   - echart message- for normal results that have been seen by the patient already.   - telephone call: abnormal results or if patient has not viewed results in their echart.   Benign Positional Vertigo Vertigo is the feeling that you or your surroundings are moving when they are not. Benign positional vertigo is the most common form of vertigo. This is usually a harmless condition (benign). This condition is positional. This means that symptoms are triggered by certain movements and positions. This condition can be dangerous if it occurs while you are doing something that could cause harm to yourself or others. This includes activities such as driving or operating machinery. What are the causes? The inner ear has fluid-filled canals that help your brain sense movement and balance. When the fluid moves, the brain receives messages about your body's position. With benign positional vertigo, calcium crystals in the inner ear break free and disturb the inner ear area. This causes your brain to receive confusing messages about your body's position. What increases the risk? You are more likely to develop this condition if: You are a woman. You are 7 years of age or older. You have recently had a head injury. You have an inner ear disease. What are the signs or symptoms? Symptoms of this condition usually happen when you move your head or your eyes in different directions. Symptoms may start suddenly and usually last for less than a minute. They include: Loss of balance and falling. Feeling like you are spinning or moving. Feeling like your surroundings are spinning or moving. Nausea and vomiting. Blurred  vision. Dizziness. Involuntary eye movement (nystagmus). Symptoms can be mild and cause only minor problems, or they can be severe and interfere with daily life. Episodes of benign positional vertigo may return (recur) over time. Symptoms may also improve over time. How is this diagnosed? This condition may be diagnosed based on: Your medical history. A physical exam of the head, neck, and ears. Positional tests to check for or stimulate vertigo. You may be asked to turn your head and change positions, such as going from sitting to lying down. A health care provider will watch for symptoms of vertigo. You may be referred to a health care provider who specializes in ear, nose, and throat problems (ENT or otolaryngologist) or a provider who specializes in disorders of the nervous system (neurologist). How is this treated?  This condition may be treated in a session in which your health care provider moves your head in specific positions to help the displaced crystals in your inner ear move. Treatment for this condition may take several sessions. Surgery may be needed in severe cases, but this is rare. In some cases, benign positional vertigo may resolve on its own in 2-4 weeks. Follow these instructions at home: Safety Move slowly. Avoid sudden body or head movements or certain positions, as told by your health care provider. Avoid driving or operating machinery until your health care provider says it is safe. Avoid doing any tasks that would be dangerous to you or others if vertigo occurs. If you have trouble walking or keeping your balance, try using a cane for  stability. If you feel dizzy or unstable, sit down right away. Return to your normal activities as told by your health care provider. Ask your health care provider what activities are safe for you. General instructions Take over-the-counter and prescription medicines only as told by your health care provider. Drink enough fluid to keep  your urine pale yellow. Keep all follow-up visits. This is important. Contact a health care provider if: You have a fever. Your condition gets worse or you develop new symptoms. Your family or friends notice any behavioral changes. You have nausea or vomiting that gets worse. You have numbness or a prickling and tingling sensation. Get help right away if you: Have difficulty speaking or moving. Are always dizzy or faint. Develop severe headaches. Have weakness in your legs or arms. Have changes in your hearing or vision. Develop a stiff neck. Develop sensitivity to light. These symptoms may represent a serious problem that is an emergency. Do not wait to see if the symptoms will go away. Get medical help right away. Call your local emergency services (911 in the [place] Do not drive yourself to the hospital. Summary Vertigo is the feeling that you or your surroundings are moving when they are not. Benign positional vertigo is the most common form of vertigo. This condition is caused by calcium crystals in the inner ear that become displaced. This causes a disturbance in an area of the inner ear that helps your brain sense movement and balance. Symptoms include loss of balance and falling, feeling that you or your surroundings are moving, nausea and vomiting, and blurred vision. This condition can be diagnosed based on symptoms, a physical exam, and positional tests. Follow safety instructions as told by your health care provider and keep all follow-up visits. This is important. This information is not intended to replace advice given to you by your health care provider. Make sure you discuss any questions you have with your health care provider. Document Revised: 10/21/2020 Document Reviewed: 10/21/2020 Elsevier Patient Education  2023 [employer]  How to Perform the Epley Maneuver The Epley maneuver is an exercise that relieves symptoms of vertigo. Vertigo is the feeling that you or  your surroundings are moving when they are not. When you feel vertigo, you may feel like the room is spinning and may have trouble walking. The Epley maneuver is used for a type of vertigo caused by a calcium deposit in a part of the inner ear. The maneuver involves changing head positions to help the deposit move out of the area. You can do this maneuver at home whenever you have symptoms of vertigo. You can repeat it in 24 hours if your vertigo has not gone away. Even though the Epley maneuver may relieve your vertigo for a few weeks, it is possible that your symptoms will return. This maneuver relieves vertigo, but it does not relieve dizziness. What are the risks? If it is done correctly, the Epley maneuver is considered safe. Sometimes it can lead to dizziness or nausea that goes away after a short time. If you develop other symptoms--such as changes in vision, weakness, or numbness--stop doing the maneuver and call your health care provider. Supplies needed: A bed or table. A pillow. How to do the Epley maneuver     Sit on the edge of a bed or table with your back straight and your legs extended or hanging over the edge of the bed or table. Turn your head halfway toward the affected ear or side  as told by your health care provider. Lie backward quickly with your head turned until you are lying flat on your back. Your head should dangle (head-hanging position). You may want to position a pillow under your shoulders. Hold this position for at least 30 seconds. If you feel dizzy or have symptoms of vertigo, continue to hold the position until the symptoms stop. Turn your head to the opposite direction until your unaffected ear is facing down. Your head should continue to dangle. Hold this position for at least 30 seconds. If you feel dizzy or have symptoms of vertigo, continue to hold the position until the symptoms stop. Turn your whole body to the same side as your head so that you are  positioned on your side. Your head will now be nearly facedown and no longer needs to dangle. Hold for at least 30 seconds. If you feel dizzy or have symptoms of vertigo, continue to hold the position until the symptoms stop. Sit back up. You can repeat the maneuver in 24 hours if your vertigo does not go away. Follow these instructions at home: For 24 hours after doing the Epley maneuver: Keep your head in an upright position. When lying down to sleep or rest, keep your head raised (elevated) with two or more pillows. Avoid excessive neck movements. Activity Do not drive or use machinery if you feel dizzy. After doing the Epley maneuver, return to your normal activities as told by your health care provider. Ask your health care provider what activities are safe for you. General instructions Drink enough fluid to keep your urine pale yellow. Do not drink alcohol. Take over-the-counter and prescription medicines only as told by your health care provider. Keep all follow-up visits. This is important. Preventing vertigo symptoms Ask your health care provider if there is anything you should do at home to prevent vertigo. He or she may recommend that you: Keep your head elevated with two or more pillows while you sleep. Do not sleep on the side of your affected ear. Get up slowly from bed. Avoid sudden movements during the day. Avoid extreme head positions or movement, such as looking up or bending over. Contact a health care provider if: Your vertigo gets worse. You have other symptoms, including: Nausea. Vomiting. Headache. Get help right away if you: Have vision changes. Have a headache or neck pain that is severe or getting worse. Cannot stop vomiting. Have new numbness or weakness in any part of your body. These symptoms may represent a serious problem that is an emergency. Do not wait to see if the symptoms will go away. Get medical help right away. Call your local emergency  services (911 in the [place] Do not drive yourself to the hospital. Summary Vertigo is the feeling that you or your surroundings are moving when they are not. The Epley maneuver is an exercise that relieves symptoms of vertigo. If the Epley maneuver is done correctly, it is considered safe. This information is not intended to replace advice given to you by your health care provider. Make sure you discuss any questions you have with your health care provider. Document Revised: 10/21/2020 Document Reviewed: 10/21/2020 Elsevier Patient Education  2023 [employer].

## 2022-10-26 ENCOUNTER — Telehealth: Payer: Self-pay | Admitting: Family Medicine

## 2022-10-26 NOTE — Telephone Encounter (Signed)
Spoke with pt regarding labs and instructions.   

## 2022-10-26 NOTE — Telephone Encounter (Signed)
Please inform patient: Her blood cell counts, iron levels and electrolytes are all normal. Her liver and kidney function are normal. Thyroid levels are normal. B12 is very low at 251.  This can result in fatigue and occasionally dizziness or headache.  I am not certain if we can blame all of her symptoms on low B12.  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  I have placed that referral for her. However, she will benefit by supplementing B12 and I would recommend she have B12 injections in the office every 2 weeks for 6 doses to boost her B12 into normal range.  Typically we shoot for a B12 level at least over 500. -If she elects not to have injections, she can also use the B12 1000 mcg sublingual solution that is over-the-counter.  In order for her to absorb appropriately she would likely require to use the sublingual/under the counter formation of this supplement.  If she elects to receive B12 injections-follow-up in 3 months with provider, on day due for last B12 injection and we will check her B12 levels prior to injection on that day. However, if her vertigo does not improve with vestibular rehab and B12 supplements, we would want to see her back sooner around 1 month, or more urgently if worsens.

## 2022-10-31 ENCOUNTER — Ambulatory Visit (INDEPENDENT_AMBULATORY_CARE_PROVIDER_SITE_OTHER): Payer: 59

## 2022-10-31 DIAGNOSIS — E538 Deficiency of other specified B group vitamins: Secondary | ICD-10-CM

## 2022-10-31 MED ORDER — CYANOCOBALAMIN 1000 MCG/ML IJ SOLN
1000.0000 ug | INTRAMUSCULAR | Status: AC
  Administered 2022-10-31 – 2023-01-03 (×5): 1000 ug via INTRAMUSCULAR

## 2022-10-31 NOTE — Progress Notes (Signed)
Pt here for monthly B12 injection per Kuneff   B12 1000mcg given IM, and pt tolerated injection well.   Next B12 injection scheduled for 2 weeks. 

## 2022-11-14 ENCOUNTER — Ambulatory Visit (INDEPENDENT_AMBULATORY_CARE_PROVIDER_SITE_OTHER): Payer: 59

## 2022-11-14 ENCOUNTER — Other Ambulatory Visit (HOSPITAL_COMMUNITY): Payer: Self-pay

## 2022-11-14 ENCOUNTER — Other Ambulatory Visit: Payer: Self-pay | Admitting: Family Medicine

## 2022-11-14 DIAGNOSIS — E538 Deficiency of other specified B group vitamins: Secondary | ICD-10-CM

## 2022-11-14 NOTE — Progress Notes (Signed)
Pt here for B12 injection per Dr.Kuneff; B12 injections in the office every 2 weeks for 6 doses   B12 given IM, and pt tolerated injection well.  Next B12 injection scheduled for 12/06/21

## 2022-11-14 NOTE — Telephone Encounter (Signed)
Had labs but hasn't had CPE/CMC this year.

## 2022-11-15 ENCOUNTER — Other Ambulatory Visit (HOSPITAL_COMMUNITY): Payer: Self-pay

## 2022-11-15 MED ORDER — POLYSACCHARIDE IRON COMPLEX 150 MG PO CAPS
150.0000 mg | ORAL_CAPSULE | ORAL | 3 refills | Status: AC
  Filled 2022-11-15: qty 45, 90d supply, fill #0
  Filled 2023-02-15: qty 45, 90d supply, fill #1
  Filled 2023-05-29: qty 45, 90d supply, fill #2
  Filled 2023-10-02: qty 45, 90d supply, fill #3

## 2022-11-15 NOTE — Telephone Encounter (Signed)
sent 

## 2022-11-23 IMAGING — MG MM DIGITAL SCREENING BILAT W/ TOMO AND CAD
8 series · 9 of 24 positions shown · non-contrast
Comparison: Previous exam(s).

CLINICAL DATA: Screening.

EXAM:
DIGITAL SCREENING BILATERAL MAMMOGRAM WITH TOMOSYNTHESIS AND CAD
TECHNIQUE: Bilateral screening digital craniocaudal and mediolateral oblique
mammograms were obtained. Bilateral screening digital breast
tomosynthesis was performed. The images were evaluated with
computer-aided detection.

[R MLO synth-2D]
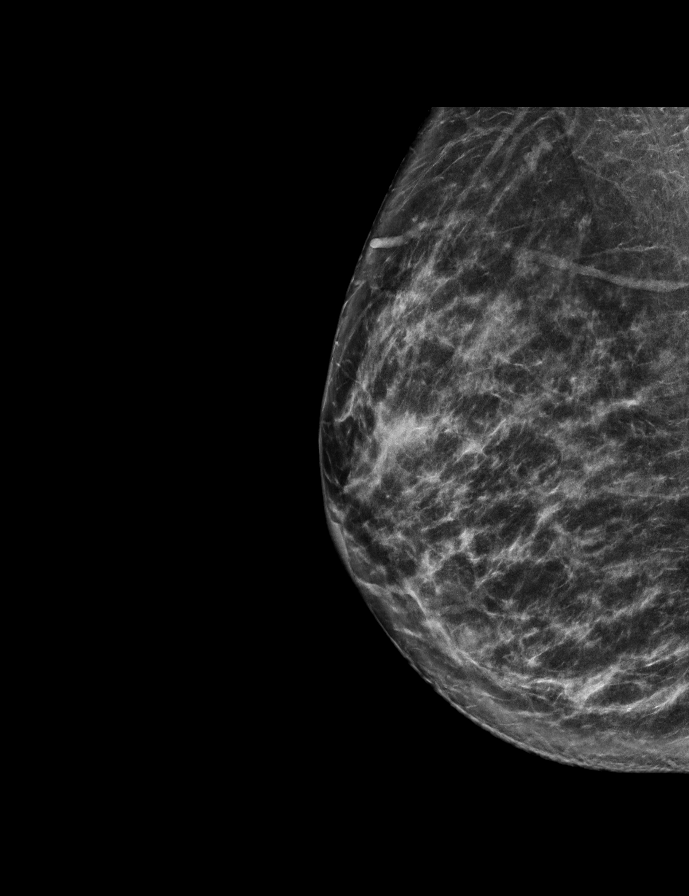

[R CC synth-2D]
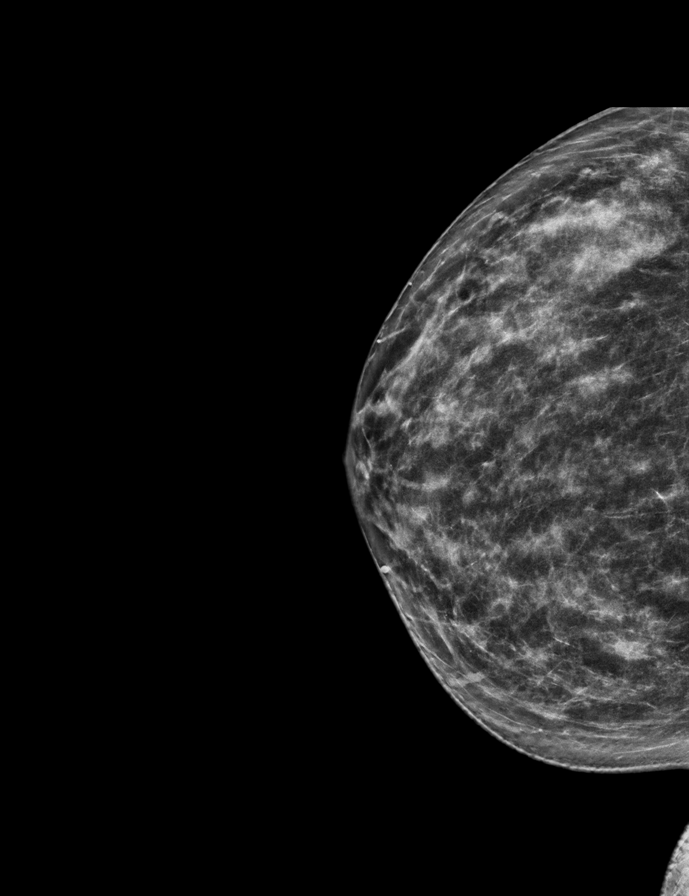

[L CC synth-2D]
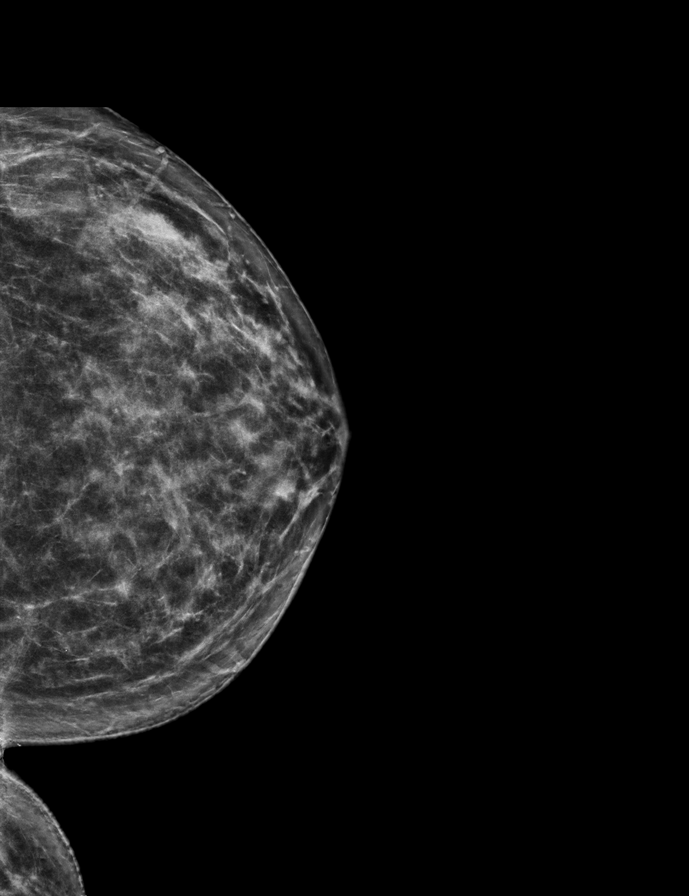

[L MLO synth-2D]
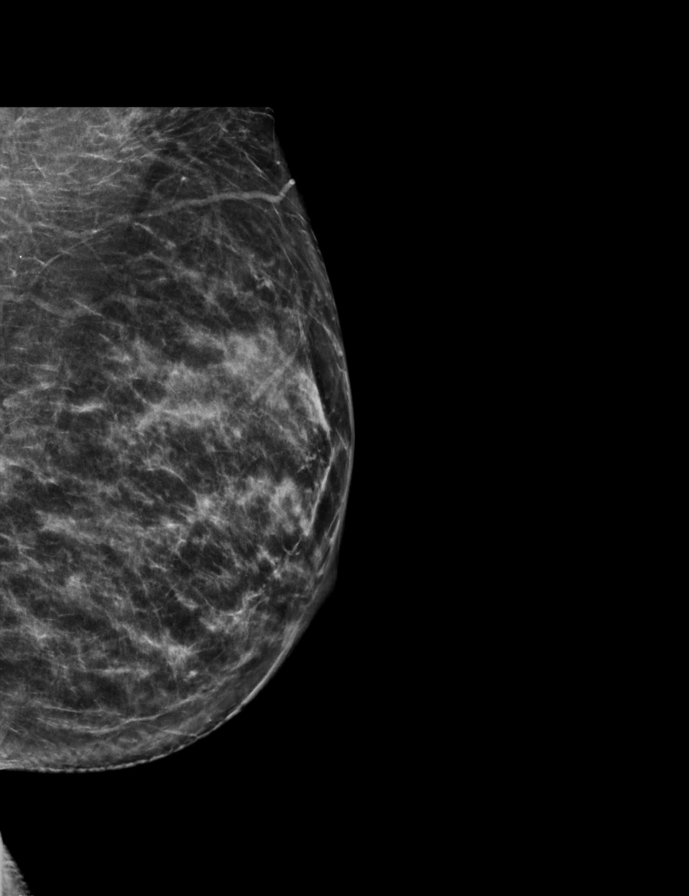

[R MLO tomo · 2 of 72 frames shown]
[frame 24/72]
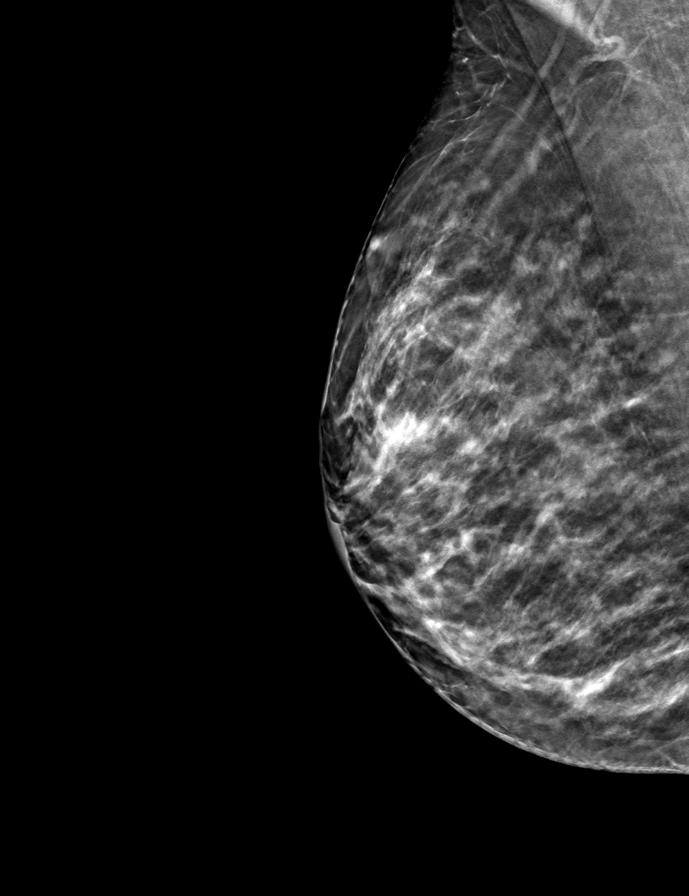
[frame 37/72]
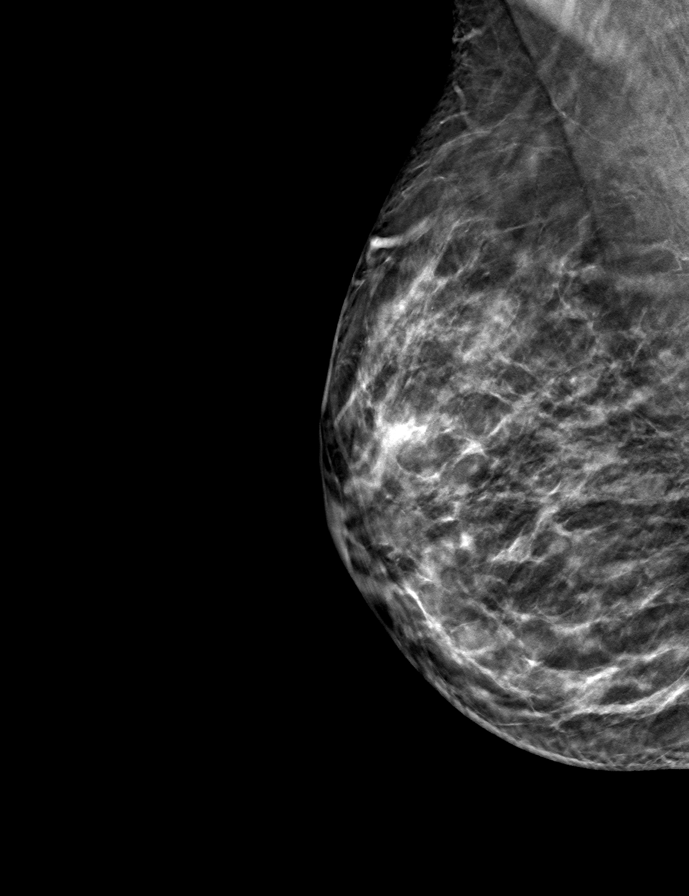

[R CC tomo · tomo slice 35/69.0]
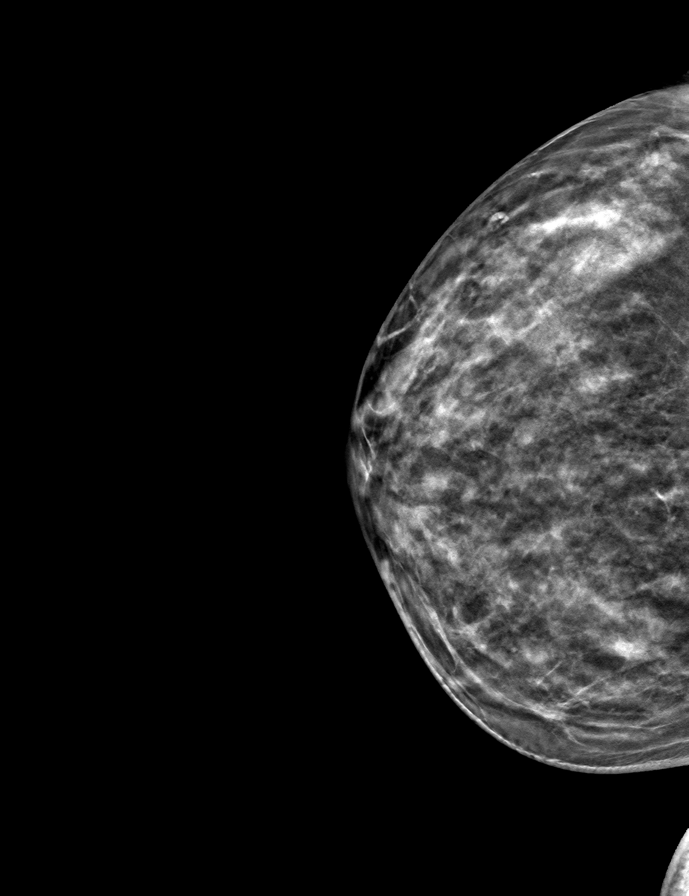

[L CC tomo · tomo slice 35/69.0]
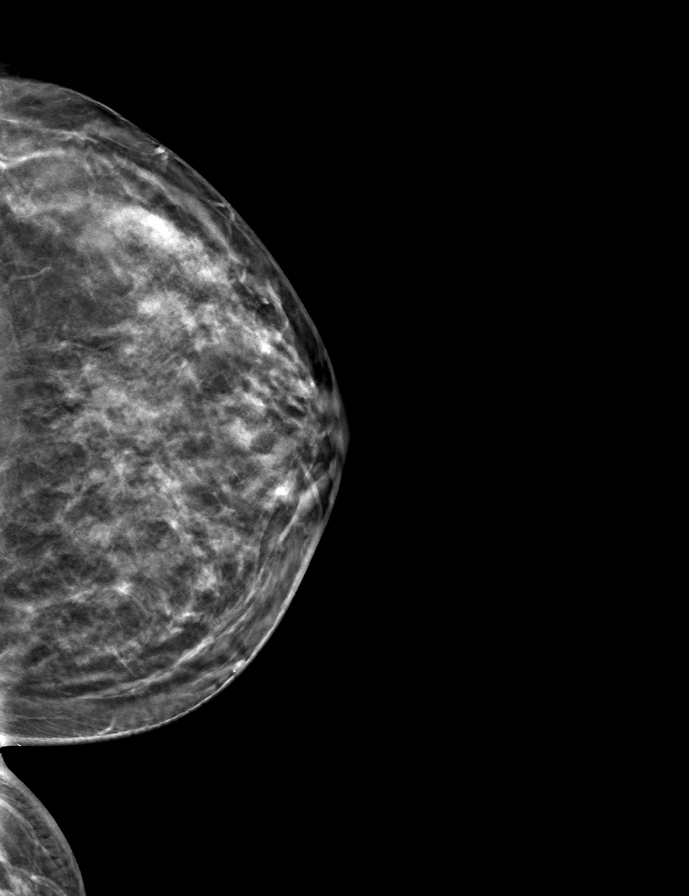

[L MLO tomo · tomo slice 33/66.0]
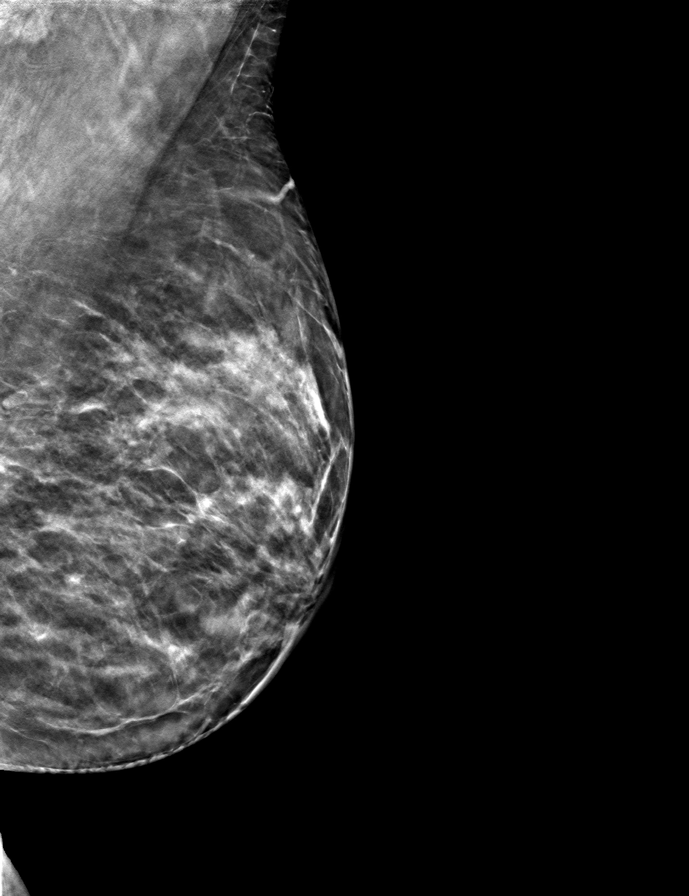

[9 of 24 positions shown; findings below may reference images not displayed]

ACR Breast Density Category c: The breast tissue is heterogeneously
dense, which may obscure small masses.
FINDINGS: There are no findings suspicious for malignancy.
IMPRESSION: No mammographic evidence of malignancy. A result letter of this
screening mammogram will be mailed directly to the patient.

RECOMMENDATION:
Screening mammogram in one year. (Code:Q3-W-BC3)

BI-RADS CATEGORY  1: Negative.

## 2022-12-06 ENCOUNTER — Ambulatory Visit (INDEPENDENT_AMBULATORY_CARE_PROVIDER_SITE_OTHER): Payer: Commercial Managed Care - PPO

## 2022-12-06 ENCOUNTER — Other Ambulatory Visit (HOSPITAL_COMMUNITY): Payer: Self-pay

## 2022-12-06 ENCOUNTER — Other Ambulatory Visit: Payer: Self-pay

## 2022-12-06 DIAGNOSIS — E538 Deficiency of other specified B group vitamins: Secondary | ICD-10-CM | POA: Diagnosis not present

## 2022-12-06 NOTE — Progress Notes (Signed)
Pt here today for biweekly B12 shot. Shot was given with no issue. Rx provided by office.   

## 2022-12-20 ENCOUNTER — Ambulatory Visit (INDEPENDENT_AMBULATORY_CARE_PROVIDER_SITE_OTHER): Payer: Commercial Managed Care - PPO

## 2022-12-20 DIAGNOSIS — E538 Deficiency of other specified B group vitamins: Secondary | ICD-10-CM

## 2022-12-20 NOTE — Progress Notes (Signed)
Pt here for B12 injection per Dr.Kuneff; B12 injections in the office every 2 weeks for 6 doses    B12 1051mcg given IM, and pt tolerated injection well.   Next B12 injection scheduled for 2 wks

## 2023-01-03 ENCOUNTER — Ambulatory Visit (INDEPENDENT_AMBULATORY_CARE_PROVIDER_SITE_OTHER): Payer: Commercial Managed Care - PPO

## 2023-01-03 DIAGNOSIS — E538 Deficiency of other specified B group vitamins: Secondary | ICD-10-CM

## 2023-01-03 NOTE — Progress Notes (Signed)
Pt here today for biweekly B12 shot. Shot was given with no issue. Rx provided by office.

## 2023-01-17 ENCOUNTER — Ambulatory Visit: Payer: Commercial Managed Care - PPO

## 2023-01-18 ENCOUNTER — Ambulatory Visit: Payer: Commercial Managed Care - PPO | Admitting: Family Medicine

## 2023-01-24 ENCOUNTER — Ambulatory Visit: Payer: Commercial Managed Care - PPO | Admitting: Family Medicine

## 2023-01-27 ENCOUNTER — Ambulatory Visit (INDEPENDENT_AMBULATORY_CARE_PROVIDER_SITE_OTHER): Payer: Commercial Managed Care - PPO | Admitting: Family Medicine

## 2023-01-27 DIAGNOSIS — Z91199 Patient's noncompliance with other medical treatment and regimen due to unspecified reason: Secondary | ICD-10-CM

## 2023-01-27 DIAGNOSIS — E538 Deficiency of other specified B group vitamins: Secondary | ICD-10-CM
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
## 2023-02-03 ENCOUNTER — Encounter: Payer: Self-pay | Admitting: Family Medicine

## 2023-02-07 ENCOUNTER — Encounter: Payer: Self-pay | Admitting: Family Medicine

## 2023-02-07 ENCOUNTER — Ambulatory Visit: Payer: Commercial Managed Care - PPO | Admitting: Family Medicine

## 2023-02-07 VITALS — BP 112/77 | HR 72 | Temp 98.0°F | Ht 63.0 in | Wt 122.4 lb

## 2023-02-07 DIAGNOSIS — E538 Deficiency of other specified B group vitamins: Secondary | ICD-10-CM | POA: Diagnosis not present

## 2023-02-07 LAB — VITAMIN B12: Vitamin B-12: 390 pg/mL (ref 211–911)

## 2023-02-07 MED ORDER — CYANOCOBALAMIN 1000 MCG/ML IJ SOLN
1000.0000 ug | Freq: Once | INTRAMUSCULAR | Status: AC
  Administered 2023-02-07: 1000 ug via INTRAMUSCULAR

## 2023-02-07 NOTE — Addendum Note (Signed)
Addended by: Beatrix Fetters on: 02/07/2023 02:22 PM   Modules accepted: Orders
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
## 2023-02-07 NOTE — Patient Instructions (Signed)
No follow-ups on file.        Great to see you today.  I have refilled the medication(s) we provide.   If labs were collected, we will inform you of lab results once received either by echart message or telephone call.   - echart message- for normal results that have been seen by the patient already.   - telephone call: abnormal results or if patient has not viewed results in their echart.  

## 2023-02-09 ENCOUNTER — Telehealth: Payer: Self-pay | Admitting: Family Medicine

## 2023-02-09 NOTE — Telephone Encounter (Signed)
Please call patient: Her B12 levels improved but she is not maintaining B12 levels long-term. Therefore I would recommend she take the sublingual B12 2000 mcg daily, the next time we see each other we can recheck her B12 to ensure she is maintaining levels.  Or if her symptoms return follow-up sooner.  Hopefully the daily sublingual solution at the 2000 mcg will maintain her levels adequately, if not we may need to offer her the once monthly injection in addition to her daily supplementation.

## 2023-02-09 NOTE — Telephone Encounter (Signed)
Spoke with patient regarding results/recommendations.  

## 2023-02-15 ENCOUNTER — Other Ambulatory Visit (HOSPITAL_COMMUNITY): Payer: Self-pay

## 2023-02-19 ENCOUNTER — Other Ambulatory Visit (HOSPITAL_BASED_OUTPATIENT_CLINIC_OR_DEPARTMENT_OTHER): Payer: Self-pay | Admitting: Obstetrics & Gynecology

## 2023-02-19 DIAGNOSIS — Z3044 Encounter for surveillance of vaginal ring hormonal contraceptive device: Secondary | ICD-10-CM

## 2023-02-20 ENCOUNTER — Other Ambulatory Visit (HOSPITAL_COMMUNITY): Payer: Self-pay

## 2023-02-20 MED ORDER — ETONOGESTREL-ETHINYL ESTRADIOL 0.12-0.015 MG/24HR VA RING
VAGINAL_RING | VAGINAL | 1 refills | Status: DC
  Filled 2023-02-20: qty 3, 84d supply, fill #0

## 2023-03-16 DIAGNOSIS — H5213 Myopia, bilateral: Secondary | ICD-10-CM | POA: Diagnosis not present

## 2023-03-16 DIAGNOSIS — H52203 Unspecified astigmatism, bilateral: Secondary | ICD-10-CM | POA: Diagnosis not present

## 2023-03-28 ENCOUNTER — Other Ambulatory Visit (HOSPITAL_COMMUNITY): Payer: Self-pay

## 2023-03-28 ENCOUNTER — Ambulatory Visit (INDEPENDENT_AMBULATORY_CARE_PROVIDER_SITE_OTHER): Payer: Commercial Managed Care - PPO | Admitting: Obstetrics & Gynecology

## 2023-03-28 ENCOUNTER — Encounter (HOSPITAL_BASED_OUTPATIENT_CLINIC_OR_DEPARTMENT_OTHER): Payer: Self-pay | Admitting: Obstetrics & Gynecology

## 2023-03-28 VITALS — BP 123/80 | HR 64 | Ht 64.0 in | Wt 122.0 lb

## 2023-03-28 DIAGNOSIS — Z01419 Encounter for gynecological examination (general) (routine) without abnormal findings: Secondary | ICD-10-CM

## 2023-03-28 DIAGNOSIS — Z3044 Encounter for surveillance of vaginal ring hormonal contraceptive device: Secondary | ICD-10-CM | POA: Diagnosis not present

## 2023-03-28 DIAGNOSIS — E538 Deficiency of other specified B group vitamins: Secondary | ICD-10-CM

## 2023-03-28 MED ORDER — ETONOGESTREL-ETHINYL ESTRADIOL 0.12-0.015 MG/24HR VA RING
VAGINAL_RING | VAGINAL | 4 refills | Status: DC
  Filled 2023-03-28: qty 3, fill #0
  Filled 2023-05-29: qty 3, 84d supply, fill #0
  Filled 2023-08-21: qty 3, 84d supply, fill #1
  Filled 2023-11-12: qty 3, 84d supply, fill #2
  Filled 2024-01-29: qty 3, 84d supply, fill #3

## 2023-03-28 NOTE — Progress Notes (Signed)
42 y.o. [id number] Married [place] female here for annual exam.  Doing well.  Using vaginal ring for contraception.  Flow is 5-7.  Heaviest days are day 2 and 3.    Patient's last menstrual period was 03/14/2023.          Sexually active: Yes.    The current method of family planning is vaginal ring..    Smoker:  no  Health Maintenance: Pap:  02/11/2022 Negative History of abnormal Pap:  no MMG:  09/22/2022 Negative Colonoscopy:  Guidelines reviewed Screening Labs: 10/2022   reports that she has never smoked. She has never used smokeless tobacco. She reports current alcohol use of about 1.0 standard drink of alcohol per week. She reports that she does not use drugs.  Past Medical History:  Diagnosis Date   Gestational diabetes    Lymphedema    left leg    Past Surgical History:  Procedure Laterality Date   NO PAST SURGERIES      Current Outpatient Medications  Medication Sig Dispense Refill   etonogestrel-ethinyl estradiol (NUVARING) 0.12-0.015 MG/24HR vaginal ring Insert 1 ring vaginally once a month as directed. 3 each 1   iron polysaccharides (FERREX 150) 150 MG capsule Take 1 capsule  by mouth every other day. 45 capsule 3   No current facility-administered medications for this visit.    Family History  Problem Relation Age of Onset   Hypertension Mother    Hypertension Father    Breast cancer Maternal Grandmother 69       passed away at 71 in stage 4 at diagnosis.    Hypertension Maternal Grandfather    Cancer Paternal Grandmother     ROS: Constitutional: negative Genitourinary:negative  Exam:   BP 123/80 (BP Location: Right Arm, Patient Position: Sitting, Cuff Size: Normal)   Pulse 64   Ht  (1.626 m) Comment: Reported  Wt 122 lb (55.3 kg)   LMP 03/14/2023   BMI 20.94 kg/m   Height:  (162.6 cm) (Reported)  General appearance: alert, cooperative and appears stated age Head: Normocephalic, without obvious abnormality, atraumatic Neck: no  adenopathy, supple, symmetrical, trachea midline and thyroid normal to inspection and palpation Lungs: clear to auscultation bilaterally Breasts: normal appearance, no masses or tenderness Heart: regular rate and rhythm Abdomen: soft, non-tender; bowel sounds normal; no masses,  no organomegaly Extremities: extremities normal, atraumatic, no cyanosis or edema Skin: Skin color, texture, turgor normal. No rashes or lesions Lymph nodes: Cervical, supraclavicular, and axillary nodes normal. No abnormal inguinal nodes palpated Neurologic: Grossly normal   Pelvic: External genitalia:  no lesions              Urethra:  normal appearing urethra with no masses, tenderness or lesions              Bartholins and Skenes: normal                 Vagina: normal appearing vagina with normal color and no discharge, no lesions              Cervix: no lesions              Pap taken: No. Bimanual Exam:  Uterus:  normal size, contour, position, consistency, mobility, non-tender              Adnexa: normal adnexa and no mass, fullness, tenderness               Rectovaginal: Confirms  Anus:  normal sphincter tone, no lesions  Chaperone, Ina Homes, CMA, was present for exam.  Assessment/Plan: 1. Well woman exam with routine gynecological exam - Pap smear neg 2023 - Mammogram up to date - Colonoscopy guidelines reviewed - lab work done with PCP, Dr. Claiborne Billings - vaccines reviewed/updated  2. Encounter for surveillance of vaginal ring hormonal contraceptive device - etonogestrel-ethinyl estradiol (NUVARING) 0.12-0.015 MG/24HR vaginal ring; Insert 1 ring vaginally once a month as directed.  Dispense: 3 each; Refill: 4  3. Vitamin B12 deficiency

## 2023-05-22 ENCOUNTER — Ambulatory Visit: Payer: Commercial Managed Care - PPO | Admitting: Dermatology

## 2023-05-29 ENCOUNTER — Other Ambulatory Visit (HOSPITAL_COMMUNITY): Payer: Self-pay

## 2023-05-29 ENCOUNTER — Other Ambulatory Visit: Payer: Self-pay

## 2023-06-13 ENCOUNTER — Ambulatory Visit (INDEPENDENT_AMBULATORY_CARE_PROVIDER_SITE_OTHER): Payer: Commercial Managed Care - PPO | Admitting: Dermatology

## 2023-06-13 ENCOUNTER — Encounter: Payer: Self-pay | Admitting: Dermatology

## 2023-06-13 VITALS — BP 111/74 | HR 62

## 2023-06-13 DIAGNOSIS — L700 Acne vulgaris: Secondary | ICD-10-CM

## 2023-06-13 MED ORDER — ARAZLO 0.045 % EX LOTN: TOPICAL_LOTION | CUTANEOUS | 2 refills | Status: AC

## 2023-06-13 NOTE — Patient Instructions (Addendum)
Thank you for visiting our clinic today. I appreciate your commitment to managing your skin health and am pleased to support you in this journey.  Based on our discussion, here are the tailored instructions for your acne treatment regimen:  - Morning Routine:   - Use CeraVe salicylic acid wash to control oil and exfoliate.     - Evening Routine:   - Apply a thin layer of hyaluronic acid after washing your face.   - Use Arazlo (tazarotene) lotion, starting with two applications per week (e.g., Monday and Thursday). Apply a pea-sized amount.   - If experiencing dryness, apply a moisturizer over the Arazlo.  - Additional Skincare Tips:   - Limit the use of glycolic acid to once a week, ensuring it is on a different day from Arazlo.   - Avoid using salicylic acid wipes   - Medication Details:   - Arazlo is available through [facility] in [place], which offers direct delivery.   - The cost is $25 with commercial insurance or a maximum of $60 without.   - The pharmacy will contact you for insurance details and delivery arrangements.  Please follow these instructions carefully and observe how your skin responds. We aim for gradual improvement without causing excessive dryness or irritation.  Should you have any questions or if adjustments are needed, do not hesitate to contact our office. We are here to assist you in achieving the best possible outcomes for your skin health.  ACNE   Treatment Plan: -Continue Sal Acid in the morning to face -Arazlo to face twice a week -Start Hyaluronic Acid twice a week with Arazlo--can use daily -Continue Glycolic Acid once a week     Due to recent changes in healthcare laws, you may see results of your pathology and/or laboratory studies on MyChart before the doctors have had a chance to review them. We understand that in some cases there may be results that are confusing or concerning to you. Please understand that not all results are  received at the same time and often the doctors may need to interpret multiple results in order to provide you with the best plan of care or course of treatment. Therefore, we ask that you please give [place] 2 business days to thoroughly review all your results before contacting the office for clarification. Should we see a critical lab result, you will be contacted sooner.   If You Need Anything After Your Visit  If you have any questions or concerns for your doctor, please call our main line at [phone] If no one answers, please leave a voicemail as directed and we will return your call as soon as possible. Messages left after 4 pm will be answered the following business day.   You may also send [place] a message via MyChart. We typically respond to MyChart messages within 1-2 business days.  For prescription refills, please ask your pharmacy to contact our office. Our fax number is [phone].  If you have an urgent issue when the clinic is closed that cannot wait until the next business day, you can page your doctor at the number below.    Please note that while we do our best to be available for urgent issues outside of office hours, we are not available 24/7.   If you have an urgent issue and are unable to reach [place], you may choose to seek medical care at your doctor's office, retail clinic, urgent care center, or emergency room.  If  you have a medical emergency, please immediately call 911 or go to the emergency department. In the event of inclement weather, please call our main line at [phone] for an update on the status of any delays or closures.  Dermatology Medication Tips: Please keep the boxes that topical medications come in in order to help keep track of the instructions about where and how to use these. Pharmacies typically print the medication instructions only on the boxes and not directly on the medication tubes.   If your medication is too expensive, please contact our  office at [phone] or send [place] a message through MyChart.   We are unable to tell what your co-pay for medications will be in advance as this is different depending on your insurance coverage. However, we may be able to find a substitute medication at lower cost or fill out paperwork to get insurance to cover a needed medication.   If a prior authorization is required to get your medication covered by your insurance company, please allow [place] 1-2 business days to complete this process.  Drug prices often vary depending on where the prescription is filled and some pharmacies may offer cheaper prices.  The website www.goodrx.com contains coupons for medications through different pharmacies. The prices here do not account for what the cost may be with help from insurance (it may be cheaper with your insurance), but the website can give you the price if you did not use any insurance.  - You can print the associated coupon and take it with your prescription to the pharmacy.  - You may also stop by our office during regular business hours and pick up a GoodRx coupon card.  - If you need your prescription sent electronically to a different pharmacy, notify our office through [facility] or by phone at [phone]

## 2023-06-13 NOTE — Progress Notes (Unsigned)
   New Patient Visit   Subjective  Jennifer Mcdowell is a 42 y.o. female who presents for the following: Acne Vulgaris on the face. Pt was treated  in the past where she was given Accutane and it cleared her up. She finished 2 years ago after being on it 1 year. She noticed recurrence of acne about 6 months ago. She washes with Cerave face wash and applies 2% sal acid every other day. Pt states that today is a good day, acne worsens around her cycle. She uses a vaginal ring but not oral birth control.   The following portions of the chart were reviewed this encounter and updated as appropriate: medications, allergies, medical history  Review of Systems:  No other skin or systemic complaints except as noted in HPI or Assessment and Plan.  Objective  Well appearing patient in no apparent distress; mood and affect are within normal limits.  Areas Examined: Face, chest and back  Relevant exam findings are noted in the Assessment and Plan.        Assessment & Plan    ACNE VULGARIS Exam: Open comedones and inflammatory papules  wellcontrolled vs notatgoal vs flared  Treatment Plan: -Continue Sal Acid in the morning to face -Arazlo to face twice a week -Start Hyaluronic Acid twice a week with Arazlo--can use daily -Continue Glycolic Acid once a week      No follow-ups on file.  Owens Shark, CMA, am acting as scribe for [employer], DO.   Documentation: I have reviewed the above documentation for accuracy and completeness, and I agree with the above.  Langston Reusing, DO

## 2023-08-21 ENCOUNTER — Other Ambulatory Visit (HOSPITAL_COMMUNITY): Payer: Self-pay

## 2023-08-23 ENCOUNTER — Other Ambulatory Visit: Payer: Self-pay | Admitting: Family Medicine

## 2023-08-23 DIAGNOSIS — Z1231 Encounter for screening mammogram for malignant neoplasm of breast: Secondary | ICD-10-CM

## 2023-08-25 ENCOUNTER — Other Ambulatory Visit (HOSPITAL_COMMUNITY): Payer: Self-pay

## 2023-09-13 ENCOUNTER — Encounter: Payer: Self-pay | Admitting: Dermatology

## 2023-09-13 ENCOUNTER — Ambulatory Visit: Payer: Commercial Managed Care - PPO | Admitting: Dermatology

## 2023-09-13 VITALS — BP 105/69 | HR 72

## 2023-09-13 DIAGNOSIS — L700 Acne vulgaris: Secondary | ICD-10-CM

## 2023-09-13 MED ORDER — SPIRONOLACTONE 100 MG PO TABS: 100.0000 mg | ORAL_TABLET | Freq: Every day | ORAL | 5 refills | Status: DC

## 2023-09-13 NOTE — Progress Notes (Unsigned)
   Follow-Up Visit   Subjective  Shontavia Mickel is a 42 y.o. female who presents for the following: Acne Vulgaris  Pt is here today to follow up on acne on her face. Pt washes with vanicream and applies arazlo twice a week. She is doing glycolic acid once a week. Pt uses niacinimide in the mornings. Pt states she is still breaking out but she feels she has improved  The following portions of the chart were reviewed this encounter and updated as appropriate: medications, allergies, medical history  Review of Systems:  No other skin or systemic complaints except as noted in HPI or Assessment and Plan.  Objective  Well appearing patient in no apparent distress; mood and affect are within normal limits.  Areas Examined: Face, chest and back  Relevant exam findings are noted in the Assessment and Plan.  Exam: Open comedones and inflammatory papules        Assessment & Plan    ACNE VULGARIS  Chronic and persistent condition with duration or expected duration over one year. Condition is symptomatic/ bothersome to patient. Not currently at goal.   Treatment Plan: -Increase Arazlo to 3 nights a week -Start Spironalactone once a day -Continue Cerave sal acid wash in the mornings.Gentle cleanser at night -Restart sal acid pads on the weekends  No follow-ups on file.  Owens Shark, CMA, am acting as scribe for [employer], DO.   Documentation: I have reviewed the above documentation for accuracy and completeness, and I agree with the above.  Langston Reusing, DO

## 2023-09-13 NOTE — Patient Instructions (Signed)
Hello Dawnetta,  Thank you for visiting my office today. I appreciate your commitment to improving your skin health. Here is a summary of the key instructions we discussed:  - Arazlo Application: Increase to three nights a week (Monday, Wednesday, Friday) to manage irritation.   - Application Nights: Specifically, Monday, Wednesday, and Friday.  - Morning Routine: Continue using Salicylic acid cleanser.  - Weekend Care: Add Salicylic acid pads once a week on weekends when not using Arazlo.  - Spironolactone Prescription:   - Dosage: Start with 100 mg at night.   - Purpose: To help with hormonal acne.   - Monitoring: Watch for side effects such as lightheadedness or leg cramps.   - Hydration: Ensure adequate hydration.  - Sunscreen: Continue using the [place] sun gel as it suits your skin well.  - Follow-Up Appointment: Schedule a return visit in 3 months to assess progress.  Please follow these instructions carefully and do not hesitate to contact the office if you have any questions or concerns.  Best regards,  Dr. Langston Reusing Dermatology   Important Information  Due to recent changes in healthcare laws, you may see results of your pathology and/or laboratory studies on MyChart before the doctors have had a chance to review them. We understand that in some cases there may be results that are confusing or concerning to you. Please understand that not all results are received at the same time and often the doctors may need to interpret multiple results in order to provide you with the best plan of care or course of treatment. Therefore, we ask that you please give [place] 2 business days to thoroughly review all your results before contacting the office for clarification. Should we see a critical lab result, you will be contacted sooner.   If You Need Anything After Your Visit  If you have any questions or concerns for your doctor, please call our main line at [phone] If no one  answers, please leave a voicemail as directed and we will return your call as soon as possible. Messages left after 4 pm will be answered the following business day.   You may also send [place] a message via MyChart. We typically respond to MyChart messages within 1-2 business days.  For prescription refills, please ask your pharmacy to contact our office. Our fax number is [phone].  If you have an urgent issue when the clinic is closed that cannot wait until the next business day, you can page your doctor at the number below.    Please note that while we do our best to be available for urgent issues outside of office hours, we are not available 24/7.   If you have an urgent issue and are unable to reach [place], you may choose to seek medical care at your doctor's office, retail clinic, urgent care center, or emergency room.  If you have a medical emergency, please immediately call 911 or go to the emergency department. In the event of inclement weather, please call our main line at [phone] for an update on the status of any delays or closures.  Dermatology Medication Tips: Please keep the boxes that topical medications come in in order to help keep track of the instructions about where and how to use these. Pharmacies typically print the medication instructions only on the boxes and not directly on the medication tubes.   If your medication is too expensive, please contact our office at [phone] or send [place] a message through MyChart.  We are unable to tell what your co-pay for medications will be in advance as this is different depending on your insurance coverage. However, we may be able to find a substitute medication at lower cost or fill out paperwork to get insurance to cover a needed medication.   If a prior authorization is required to get your medication covered by your insurance company, please allow [place] 1-2 business days to complete this process.  Drug prices often vary  depending on where the prescription is filled and some pharmacies may offer cheaper prices.  The website www.goodrx.com contains coupons for medications through different pharmacies. The prices here do not account for what the cost may be with help from insurance (it may be cheaper with your insurance), but the website can give you the price if you did not use any insurance.  - You can print the associated coupon and take it with your prescription to the pharmacy.  - You may also stop by our office during regular business hours and pick up a GoodRx coupon card.  - If you need your prescription sent electronically to a different pharmacy, notify our office through [facility] or by phone at [phone]

## 2023-09-26 ENCOUNTER — Ambulatory Visit
Admission: RE | Admit: 2023-09-26 | Discharge: 2023-09-26 | Disposition: A | Discharge status: Home / Self Care | Payer: Commercial Managed Care - PPO | Source: Ambulatory Visit | Attending: Family Medicine | Admitting: Family Medicine

## 2023-09-26 DIAGNOSIS — Z1231 Encounter for screening mammogram for malignant neoplasm of breast: Secondary | ICD-10-CM | POA: Diagnosis not present

## 2023-10-12 ENCOUNTER — Other Ambulatory Visit (HOSPITAL_COMMUNITY): Payer: Self-pay

## 2023-10-17 ENCOUNTER — Other Ambulatory Visit (HOSPITAL_COMMUNITY): Payer: Self-pay

## 2023-12-18 ENCOUNTER — Encounter: Payer: Self-pay | Admitting: Dermatology

## 2023-12-18 ENCOUNTER — Ambulatory Visit: Payer: Commercial Managed Care - PPO | Admitting: Dermatology

## 2023-12-18 VITALS — BP 122/83 | HR 77

## 2023-12-18 DIAGNOSIS — L810 Postinflammatory hyperpigmentation: Secondary | ICD-10-CM

## 2023-12-18 DIAGNOSIS — Z79899 Other long term (current) drug therapy: Secondary | ICD-10-CM | POA: Diagnosis not present

## 2023-12-18 DIAGNOSIS — L649 Androgenic alopecia, unspecified: Secondary | ICD-10-CM | POA: Diagnosis not present

## 2023-12-18 DIAGNOSIS — L700 Acne vulgaris: Secondary | ICD-10-CM

## 2023-12-18 MED ORDER — SAFETY SEAL MISCELLANEOUS MISC: 1.0000 | Freq: Every morning | 6 refills | Status: AC

## 2023-12-18 MED ORDER — SAFETY SEAL MISCELLANEOUS MISC: 1.0000 | Freq: Every evening | 3 refills | Status: DC

## 2023-12-18 NOTE — Progress Notes (Signed)
 Follow-Up Visit   Subjective  Jennifer Mcdowell is a 43 y.o. female who presents for the following: Acne  Patient present today for follow up visit for Acne. Patient was last evaluated on 09/13/23. At this visit pt was prescribed Increase Arazlo  to 3 nights a week, Spironolactone  100mg  once a day, Cerave sal acid wash in the mornings & Vani Cream wash at night. Patient reports sxs are  improving . Patient denies medication changes.  Patient would also like to discuss hair shedding. Patient reports she noticed the hair has started shedding about 1 year ago. She does have chunks of hair come out during a wash. Patient has tried otc Minoxidil for about 2 months then stopped. Patient reports everyone in her family of hair thinning and shedding.  The following portions of the chart were reviewed this encounter and updated as appropriate: medications, allergies, medical history  Review of Systems:  No other skin or systemic complaints except as noted in HPI or Assessment and Plan.  Objective  Well appearing patient in no apparent distress; mood and affect are within normal limits.  A focused examination was performed of the following areas: Face an scalp  Relevant exam findings are noted in the Assessment and Plan.               Assessment & Plan   ACNE VULGARIS Exam: Resolved Open comedones and inflammatory papules  Well controlled  Treatment Plan: - Continue use of Arazlo  every other night    Hyperpigmentation Exam: Brown macules in the areas of prior acne lesions  Patient Education I counseled the patient regarding the following Skin care: Recommend minimizing sun exposure, wearing sunscreen and protective clothing. Expectations: Post Inflammatory pigmentary change is lighter or darker discoloration than surrounding skin resulting from trauma or rashes. Areas tend to normalize over time, but can take months to years.  Treatment Plan: - We will plan to  prescribe Melazmic Cream to apply on the nights you are not using Arazlo  - Recommended broad spectrum SPF30 every day, hats and sun avoidence.    ANDROGENETIC ALOPECIA (FEMALE PATTERN HAIR LOSS) Exam: Diffuse thinning of the crown and widening of the midline part with retention of the frontal hairline  Flared  Female Androgenic Alopecia is a chronic condition related to genetics and/or hormonal changes.  In women androgenetic alopecia is commonly associated with menopause but may occur any time after puberty.  It causes hair thinning primarily on the crown with widening of the part and temporal hairline recession.    Treatment Plan: - We will plan to prescribe Hormonic Hair Solution every morning - Recommended taking dial Vivascal Supplements daily to help improve thickness of hair - Recommmended taking daily Vital Protein Collagen Supplements  Long term medication management.  Patient is using long term (months to years) prescription medication  to control their dermatologic condition.  These medications require periodic monitoring to evaluate for efficacy and side effects and may require periodic laboratory monitoring.   ANDROGENETIC ALOPECIA   Related Medications Safety Seal Miscellaneous MISC Apply 1 Application topically in the morning. Medication Name: Hormonic Hair Solution (Minoxidil 7%, Finasteride 0.05%) POSTINFLAMMATORY HYPERPIGMENTATION   Related Medications Safety Seal Miscellaneous MISC Apply 1 Application topically at bedtime. Medication Name: Melaxemic Cream (Tranexamic Acid 5%, Kojic Aci 2%, Vitamin C 2.5%, Hyaluronic Acid 0.1%)  Return in about 4 months (around 04/16/2024) for Acne & AA F/U.    Documentation: I have reviewed the above documentation for accuracy and completeness, and I agree  with the above.  I, Jetta Ager, am acting as scribe for Delon Lenis, DO.  Delon Lenis, DO

## 2023-12-18 NOTE — Patient Instructions (Addendum)
 Dear Ms. Emil,  Thank you for visiting us  today. SABRA Below are the essential instructions and recommendations from our consultation today:  Skin Care:   Acne: Continue utilizing Arazlo  to fpr acne treatment and prevention avery other night.   Compounded Lightening Cream: Begin application of a specially compounded cream containing tranexamic acid, vitamin C, resveratrol, niacinamide, and kojic acid. Initially, apply as a spot treatment for two weeks, followed by application to the entire affected area. Your prescription will be forwarded to [facility].  Hair Care:   Androgenetic Alopecia Treatment: Start using a topical solution composed of minoxidil (8%) and finasteride each morning. The solution can be either blow-dried or air-dried. It is vital to adhere to daily application for optimal results.   Supplements for Hair Growth: As discussed, initiate intake of either Viviscal (two tablets daily) or Nutrafol (four tablets daily). Additionally, consider incorporating collagen supplements from [employer] into your diet.  Follow-Up: We are scheduled to meet again in four months to evaluate the progress of your hair regrowth and skin condition. We anticipate seeing noticeable improvements and the emergence of baby hairs by then.   Should you have any questions or require further assistance before our next meeting, please do not hesitate to contact our office.  Warm regards,  Dr. Delon Lenis [facility]        Important Information   Due to recent changes in healthcare laws, you may see results of your pathology and/or laboratory studies on MyChart before the doctors have had a chance to review them. We understand that in some cases there may be results that are confusing or concerning to you. Please understand that not all results are received at the same time and often the doctors may need to interpret multiple results in order to provide you with the best plan of care or  course of treatment. Therefore, we ask that you please give us  2 business days to thoroughly review all your results before contacting the office for clarification. Should we see a critical lab result, you will be contacted sooner.     If You Need Anything After Your Visit   If you have any questions or concerns for your doctor, please call our main line at [phone]. If no one answers, please leave a voicemail as directed and we will return your call as soon as possible. Messages left after 4 pm will be answered the following business day.    You may also send us  a message via MyChart. We typically respond to MyChart messages within 1-2 business days.  For prescription refills, please ask your pharmacy to contact our office. Our fax number is [phone].  If you have an urgent issue when the clinic is closed that cannot wait until the next business day, you can page your doctor at the number below.     Please note that while we do our best to be available for urgent issues outside of office hours, we are not available 24/7.    If you have an urgent issue and are unable to reach us , you may choose to seek medical care at your doctor's office, retail clinic, urgent care center, or emergency room.   If you have a medical emergency, please immediately call 911 or go to the emergency department. In the event of inclement weather, please call our main line at [phone] for an update on the status of any delays or closures.  Dermatology Medication Tips: Please keep the boxes that topical medications come  in in order to help keep track of the instructions about where and how to use these. Pharmacies typically print the medication instructions only on the boxes and not directly on the medication tubes.   If your medication is too expensive, please contact our office at [phone] or send us  a message through MyChart.    We are unable to tell what your co-pay for medications will be in  advance as this is different depending on your insurance coverage. However, we may be able to find a substitute medication at lower cost or fill out paperwork to get insurance to cover a needed medication.    If a prior authorization is required to get your medication covered by your insurance company, please allow us  1-2 business days to complete this process.   Drug prices often vary depending on where the prescription is filled and some pharmacies may offer cheaper prices.   The website www.goodrx.com contains coupons for medications through different pharmacies. The prices here do not account for what the cost may be with help from insurance (it may be cheaper with your insurance), but the website can give you the price if you did not use any insurance.  - You can print the associated coupon and take it with your prescription to the pharmacy.  - You may also stop by our office during regular business hours and pick up a GoodRx coupon card.  - If you need your prescription sent electronically to a different pharmacy, notify our office through [facility] or by phone at [phone]

## 2024-02-19 ENCOUNTER — Other Ambulatory Visit: Payer: Self-pay | Admitting: Dermatology

## 2024-03-05 ENCOUNTER — Other Ambulatory Visit (HOSPITAL_COMMUNITY): Payer: Self-pay

## 2024-03-06 ENCOUNTER — Other Ambulatory Visit (HOSPITAL_COMMUNITY): Payer: Self-pay

## 2024-03-06 MED FILL — Spironolactone Tab 100 MG: ORAL | 90 days supply | Qty: 90 | Fill #0 | Status: AC

## 2024-04-16 ENCOUNTER — Encounter: Payer: Self-pay | Admitting: Dermatology

## 2024-04-16 ENCOUNTER — Ambulatory Visit: Payer: Commercial Managed Care - PPO | Admitting: Dermatology

## 2024-04-16 ENCOUNTER — Other Ambulatory Visit (HOSPITAL_BASED_OUTPATIENT_CLINIC_OR_DEPARTMENT_OTHER): Payer: Self-pay

## 2024-04-16 DIAGNOSIS — Z79899 Other long term (current) drug therapy: Secondary | ICD-10-CM | POA: Diagnosis not present

## 2024-04-16 DIAGNOSIS — L811 Chloasma: Secondary | ICD-10-CM

## 2024-04-16 DIAGNOSIS — L649 Androgenic alopecia, unspecified: Secondary | ICD-10-CM | POA: Diagnosis not present

## 2024-04-16 DIAGNOSIS — L810 Postinflammatory hyperpigmentation: Secondary | ICD-10-CM

## 2024-04-16 DIAGNOSIS — L709 Acne, unspecified: Secondary | ICD-10-CM

## 2024-04-16 DIAGNOSIS — L700 Acne vulgaris: Secondary | ICD-10-CM

## 2024-04-16 MED ORDER — SPIRONOLACTONE 100 MG PO TABS
100.0000 mg | ORAL_TABLET | Freq: Every day | ORAL | 1 refills | Status: DC
  Filled 2024-04-16 – 2024-05-20 (×2): qty 90, 90d supply, fill #0
  Filled 2024-09-30: qty 90, 90d supply, fill #1

## 2024-04-16 NOTE — Patient Instructions (Addendum)
 Date: Tue Apr 16 2024  Dear  Jarvis Mesa,  Thank you for visiting today. Here is a summary of the key instructions:  - Medications:   - Continue using Harmonic Hair Solution with finasteride and minoxidil every morning   - Apply Harmonic Hair Solution all over the scalp, not just in the front   - Continue using Arazlo  3 nights a week   - Keep taking spironolactone  daily   - Use Cicalfate repair cream on nights when not using Arazlo   - Supplements:   - Try to take Viviscal and vital protein powder daily   - Mix vital protein powder in coffee using a frother for easier consumption  - Skin Care:   - Use a moisturizer on top of Arazlo    - Apply Melasmic cream on the same day as Arazlo  for dark spots  - Follow-up:   - Next appointment in 6 months   - Call [facility] or use MyChart when you need more Harmonic Hair Solution  - Prescriptions:   - 7-month supply of spironolactone  with 3 refills will be sent in  Please reach out if you have any questions or concerns.  Best Regards,  Dr. Louana Roup [facility]at[facility]    Important Information  Due to recent changes in healthcare laws, you may see results of your pathology and/or laboratory studies on MyChart before the doctors have had a chance to review them. We understand that in some cases there may be results that are confusing or concerning to you. Please understand that not all results are received at the same time and often the doctors may need to interpret multiple results in order to provide you with the best plan of care or course of treatment. Therefore, we ask that you please give us  2 business days to thoroughly review all your results before contacting the office for clarification. Should we see a critical lab result, you will be contacted sooner.   If You Need Anything After Your Visit  If you have any questions or concerns for your doctor, please call our main line at [phone] If no one answers, please leave a voicemail as  directed and we will return your call as soon as possible. Messages left after 4 pm will be answered the following business day.   You may also send us  a message via MyChart. We typically respond to MyChart messages within 1-2 business days.  For prescription refills, please ask your pharmacy to contact our office. Our fax number is [phone].  If you have an urgent issue when the clinic is closed that cannot wait until the next business day, you can page your doctor at the number below.    Please note that while we do our best to be available for urgent issues outside of office hours, we are not available 24/7.   If you have an urgent issue and are unable to reach us , you may choose to seek medical care at your doctor's office, retail clinic, urgent care center, or emergency room.  If you have a medical emergency, please immediately call 911 or go to the emergency department. In the event of inclement weather, please call our main line at [phone] for an update on the status of any delays or closures.  Dermatology Medication Tips: Please keep the boxes that topical medications come in in order to help keep track of the instructions about where and how to use these. Pharmacies typically print the medication instructions only on the boxes and not directly  on the medication tubes.   If your medication is too expensive, please contact our office at [phone] or send us  a message through MyChart.   We are unable to tell what your co-pay for medications will be in advance as this is different depending on your insurance coverage. However, we may be able to find a substitute medication at lower cost or fill out paperwork to get insurance to cover a needed medication.   If a prior authorization is required to get your medication covered by your insurance company, please allow us  1-2 business days to complete this process.  Drug prices often vary depending on where the prescription is  filled and some pharmacies may offer cheaper prices.  The website www.goodrx.com contains coupons for medications through different pharmacies. The prices here do not account for what the cost may be with help from insurance (it may be cheaper with your insurance), but the website can give you the price if you did not use any insurance.  - You can print the associated coupon and take it with your prescription to the pharmacy.  - You may also stop by our office during regular business hours and pick up a GoodRx coupon card.  - If you need your prescription sent electronically to a different pharmacy, notify our office through [facility] or by phone at [phone]

## 2024-04-16 NOTE — Progress Notes (Signed)
   Follow-Up Visit   Subjective  Jennifer Mcdowell is a 43 y.o. female who presents for the following: Androgenic Alopecia and Acne   Patient present today for follow up visit for acne . Patient was last evaluated on 12/18/2023. At this visit patient was prescribed arazlo . Patient reports that she is using Spironolatone. Patient reports sxs are better. Patient denies medication changes.  Patient reports that her androgenic alopecia is better. Patient stated that she is using the Hormonic Solution. Patient reports that she does take the vivascal supplements.   The following portions of the chart were reviewed this encounter and updated as appropriate: medications, allergies, medical history  Review of Systems:  No other skin or systemic complaints except as noted in HPI or Assessment and Plan.  Objective  Well appearing patient in no apparent distress; mood and affect are within normal limits.  A focused examination was performed of the following areas:   Relevant exam findings are noted in the Assessment and Plan.    Assessment & Plan   1. Androgenetic Alopecia - Assessment: Patient reports using Harmonic Hair Solution containing finasteride and minoxidil every morning, though not consistently. She has noticed an improvement in hair thickness. Currently applying the solution only to the front of the scalp. Examination reveals fuller scalp appearance.  - Plan:    Continue Harmonic Hair Solution with finasteride and minoxidil every morning    Apply solution to entire scalp, not just the front    Continue use most days; warned that stopping for 3 months or longer may result in shedding    Continue Viviscal and vital protein powder daily     - Recommended using a frother to mix protein powder in coffee for easier consumption    Call [facility] for refills when needed    Follow up in 6 months  2. Acne - Assessment: Patient is using Arazlo  three nights per week and taking daily  spironolactone . Skin examination reveals improvement - Plan:    Continue Arazlo  three nights per week for anti-aging and faster breakout clearance    Apply moisturizer on top of Arazlo     Use Cicalfate repair cream by [employer] on nights when not using Arazlo     Continue daily spironolactone     Prescribe 42-month supply of spironolactone  with 3 refills    Follow up in 6 months  3. Melasma - Assessment: Patient is using Melasmic cream for management of melasma. No specific assessment of current melasma status provided. - Plan:    Continue Melasmic cream    Can use Melasmic cream on the same day as Arazlo     Informed that Arazlo  will help dark spots fade faster  Follow-up in 6 months for reassessment of all conditions.     Long term medication management.  Patient is using long term (months to years) prescription medication  to control their dermatologic condition.  These medications require periodic monitoring to evaluate for efficacy and side effects and may require periodic laboratory monitoring.       Return in about 6 months (around 10/17/2024).  Exie Holler, CMA, am acting as scribe for [employer], DO.   Documentation: I have reviewed the above documentation for accuracy and completeness, and I agree with the above.  Louana Roup, DO

## 2024-04-22 ENCOUNTER — Other Ambulatory Visit (HOSPITAL_BASED_OUTPATIENT_CLINIC_OR_DEPARTMENT_OTHER): Payer: Self-pay | Admitting: Obstetrics & Gynecology

## 2024-04-22 ENCOUNTER — Other Ambulatory Visit: Payer: Self-pay | Admitting: Family Medicine

## 2024-04-22 DIAGNOSIS — Z3044 Encounter for surveillance of vaginal ring hormonal contraceptive device: Secondary | ICD-10-CM

## 2024-04-23 ENCOUNTER — Encounter (HOSPITAL_COMMUNITY): Payer: Self-pay

## 2024-04-23 ENCOUNTER — Other Ambulatory Visit (HOSPITAL_COMMUNITY): Payer: Self-pay

## 2024-04-24 ENCOUNTER — Other Ambulatory Visit (HOSPITAL_COMMUNITY): Payer: Self-pay

## 2024-04-24 MED ORDER — ETONOGESTREL-ETHINYL ESTRADIOL 0.12-0.015 MG/24HR VA RING
VAGINAL_RING | VAGINAL | 0 refills | Status: DC
  Filled 2024-04-24: qty 3, 84d supply, fill #0

## 2024-05-03 ENCOUNTER — Ambulatory Visit (HOSPITAL_BASED_OUTPATIENT_CLINIC_OR_DEPARTMENT_OTHER): Admitting: Certified Nurse Midwife

## 2024-05-16 ENCOUNTER — Ambulatory Visit (HOSPITAL_BASED_OUTPATIENT_CLINIC_OR_DEPARTMENT_OTHER): Admitting: Certified Nurse Midwife

## 2024-05-20 ENCOUNTER — Other Ambulatory Visit: Payer: Self-pay | Admitting: Family Medicine

## 2024-05-20 ENCOUNTER — Other Ambulatory Visit: Payer: Self-pay

## 2024-05-20 ENCOUNTER — Other Ambulatory Visit (HOSPITAL_COMMUNITY): Payer: Self-pay

## 2024-05-21 ENCOUNTER — Other Ambulatory Visit: Payer: Self-pay

## 2024-05-22 ENCOUNTER — Other Ambulatory Visit (HOSPITAL_COMMUNITY): Payer: Self-pay

## 2024-05-23 ENCOUNTER — Other Ambulatory Visit (HOSPITAL_COMMUNITY): Payer: Self-pay

## 2024-06-20 ENCOUNTER — Ambulatory Visit (HOSPITAL_BASED_OUTPATIENT_CLINIC_OR_DEPARTMENT_OTHER): Admitting: Certified Nurse Midwife

## 2024-07-02 ENCOUNTER — Other Ambulatory Visit (HOSPITAL_COMMUNITY)
Admission: RE | Admit: 2024-07-02 | Discharge: 2024-07-02 | Disposition: A | Discharge status: Home / Self Care | Source: Ambulatory Visit | Attending: Certified Nurse Midwife | Admitting: Certified Nurse Midwife

## 2024-07-02 ENCOUNTER — Other Ambulatory Visit (HOSPITAL_BASED_OUTPATIENT_CLINIC_OR_DEPARTMENT_OTHER): Payer: Self-pay

## 2024-07-02 ENCOUNTER — Encounter (HOSPITAL_BASED_OUTPATIENT_CLINIC_OR_DEPARTMENT_OTHER): Payer: Self-pay | Admitting: Certified Nurse Midwife

## 2024-07-02 ENCOUNTER — Ambulatory Visit (HOSPITAL_BASED_OUTPATIENT_CLINIC_OR_DEPARTMENT_OTHER): Admitting: Certified Nurse Midwife

## 2024-07-02 ENCOUNTER — Other Ambulatory Visit (HOSPITAL_COMMUNITY): Payer: Self-pay

## 2024-07-02 VITALS — BP 104/71 | HR 79 | Ht 64.0 in | Wt 122.2 lb

## 2024-07-02 DIAGNOSIS — Z01419 Encounter for gynecological examination (general) (routine) without abnormal findings: Secondary | ICD-10-CM | POA: Diagnosis not present

## 2024-07-02 DIAGNOSIS — Z124 Encounter for screening for malignant neoplasm of cervix: Secondary | ICD-10-CM | POA: Diagnosis not present

## 2024-07-02 DIAGNOSIS — Z3044 Encounter for surveillance of vaginal ring hormonal contraceptive device: Secondary | ICD-10-CM | POA: Insufficient documentation

## 2024-07-02 MED ORDER — ETONOGESTREL-ETHINYL ESTRADIOL 0.12-0.015 MG/24HR VA RING
VAGINAL_RING | VAGINAL | 12 refills | Status: AC
  Filled 2024-07-02 – 2024-07-16 (×2): qty 3, 84d supply, fill #0
  Filled 2024-09-30: qty 3, 84d supply, fill #1
  Filled 2024-12-23 – 2025-01-07 (×2): qty 3, 84d supply, fill #2

## 2024-07-02 NOTE — Progress Notes (Signed)
 43 y.o. G59P2012 Married [place] female here for annual exam.She has 2 daughters (15 and 59). Oldest will be a Holiday representative this year and may want to do Biology at [employer] and consider something in medical field. Pt was able to visit her parents in [place] this Summer.   Patient's last menstrual period was 07/02/2024.          Sexually active: Yes.    The current method of family planning is NuvaRing.     Exercising: Yes.     Smoker:  no  Health Maintenance: Pap:  02/11/2022 Negative History of abnormal Pap:  no MMG:  09/26/2023 Negative   reports that she has never smoked. She has never used smokeless tobacco. She reports current alcohol use of about 1.0 standard drink of alcohol per week. She reports that she does not use drugs.  Past Medical History:  Diagnosis Date   Gestational diabetes    Lymphedema    left leg    Past Surgical History:  Procedure Laterality Date   NO PAST SURGERIES      Current Outpatient Medications  Medication Sig Dispense Refill   etonogestrel -ethinyl estradiol  (NUVARING) 0.12-0.015 MG/24HR vaginal ring Insert 1 ring vaginally once a month as directed. 3 each 0   iron  polysaccharides (FERREX 150) 150 MG capsule Take 1 capsule  by mouth every other day. 45 capsule 3   Safety Seal Miscellaneous MISC Apply 1 Application topically at bedtime. Medication Name: Melaxemic Cream (Tranexamic Acid 5%, Kojic Aci 2%, Vitamin C 2.5%, Hyaluronic Acid 0.1%) 15 g 3   Safety Seal Miscellaneous MISC Apply 1 Application topically in the morning. Medication Name: Hormonic Hair Solution (Minoxidil 7%, Finasteride 0.05%) 30 mL 6   spironolactone  (ALDACTONE ) 100 MG tablet Take 1 tablet (100 mg total) by mouth daily. 90 tablet 1   Tazarotene  (ARAZLO ) 0.045 % LOTN Apply to face twice a week 45 g 2   No current facility-administered medications for this visit.    Family History  Problem Relation Age of Onset   Hypertension Mother    Hypertension Father    Breast cancer Maternal  Grandmother 51       passed away at 25 in stage 4 at diagnosis.    Hypertension Maternal Grandfather    Cancer Paternal Grandmother     ROS: Constitutional: negative Genitourinary:negative  Exam:   BP 104/71   Pulse 79   Ht 5' 4 (1.626 m) Comment: Reported  Wt 122 lb 3.2 oz (55.4 kg)   LMP 07/02/2024   BMI 20.98 kg/m   Height: 5' 4 (162.6 cm) (Reported)  General appearance: alert, cooperative and appears stated age Head: Normocephalic, without obvious abnormality, atraumatic Lungs: clear to auscultation bilaterally Breasts: normal appearance, no masses or tenderness, Inspection negative, No nipple retraction or dimpling, No nipple discharge or bleeding, No axillary or supraclavicular adenopathy, Normal to palpation without dominant masses Heart: regular rate and rhythm Abdomen: soft, non-tender; bowel sounds normal; no masses,  no organomegaly Extremities: extremities normal, atraumatic, no cyanosis or edema Skin: Skin color, texture, turgor normal. No rashes or lesions Lymph nodes: Cervical, supraclavicular, and axillary nodes normal. No abnormal inguinal nodes palpated Neurologic: Grossly normal   Pelvic: External genitalia:  no lesions              Urethra:  normal appearing urethra with no masses, tenderness or lesions              Bartholins and Skenes: normal  Vagina: normal appearing vagina with normal color and no discharge, no lesions              Cervix: no lesions              Pap taken: Yes.   Bimanual Exam:  Uterus:  normal size, contour, position, consistency, mobility, non-tender              Adnexa: no mass, fullness, tenderness               Rectovaginal: Confirms               Anus:  normal sphincter tone, no lesions  Chaperone, CMA, was present for exam.  Assessment/Plan:  1. Encounter for annual routine gynecological examination - continue annual screening mammograms  2. Cervical cancer screening (Primary) - Cytology - PAP( [facility])  3. Encounter for surveillance of vaginal ring hormonal contraceptive device - Discussed availability of extended regimen if desired.  RTO 1 year for annual gyn exam and prn if issues arise. Arland MARLA Roller

## 2024-07-07 LAB — CYTOLOGY - PAP
Comment: NEGATIVE
Diagnosis: NEGATIVE
High risk HPV: NEGATIVE

## 2024-07-08 ENCOUNTER — Ambulatory Visit (HOSPITAL_BASED_OUTPATIENT_CLINIC_OR_DEPARTMENT_OTHER): Payer: Self-pay | Admitting: Certified Nurse Midwife

## 2024-07-16 ENCOUNTER — Other Ambulatory Visit (HOSPITAL_COMMUNITY): Payer: Self-pay

## 2024-07-17 ENCOUNTER — Other Ambulatory Visit: Payer: Self-pay

## 2024-07-26 ENCOUNTER — Encounter: Payer: Self-pay | Admitting: Family Medicine

## 2024-07-26 ENCOUNTER — Ambulatory Visit: Admitting: Family Medicine

## 2024-07-26 VITALS — BP 110/76 | HR 73 | Temp 98.3°F | Ht 64.0 in | Wt 120.8 lb

## 2024-07-26 DIAGNOSIS — Z131 Encounter for screening for diabetes mellitus: Secondary | ICD-10-CM | POA: Diagnosis not present

## 2024-07-26 DIAGNOSIS — Z13220 Encounter for screening for lipoid disorders: Secondary | ICD-10-CM

## 2024-07-26 DIAGNOSIS — D509 Iron deficiency anemia, unspecified: Secondary | ICD-10-CM

## 2024-07-26 DIAGNOSIS — Z1231 Encounter for screening mammogram for malignant neoplasm of breast: Secondary | ICD-10-CM

## 2024-07-26 DIAGNOSIS — Z0000 Encounter for general adult medical examination without abnormal findings: Secondary | ICD-10-CM | POA: Diagnosis not present

## 2024-07-26 DIAGNOSIS — E538 Deficiency of other specified B group vitamins: Secondary | ICD-10-CM

## 2024-07-26 LAB — IBC + FERRITIN
Ferritin: 16.2 ng/mL (ref 10.0–291.0)
Iron: 133 ug/dL (ref 42–145)
Saturation Ratios: 26.2 % (ref 20.0–50.0)
TIBC: 508.2 ug/dL — ABNORMAL HIGH (ref 250.0–450.0)
Transferrin: 363 mg/dL — ABNORMAL HIGH (ref 212.0–360.0)

## 2024-07-26 LAB — CBC
HCT: 40.7 % (ref 36.0–46.0)
Hemoglobin: 13 g/dL (ref 12.0–15.0)
MCHC: 32 g/dL (ref 30.0–36.0)
MCV: 79.7 fl (ref 78.0–100.0)
Platelets: 347 K/uL (ref 150.0–400.0)
RBC: 5.11 Mil/uL (ref 3.87–5.11)
RDW: 13.6 % (ref 11.5–15.5)
WBC: 5.5 K/uL (ref 4.0–10.5)

## 2024-07-26 LAB — TSH: TSH: 3.33 u[IU]/mL (ref 0.35–5.50)

## 2024-07-26 LAB — HEMOGLOBIN A1C: Hgb A1c MFr Bld: 5.6 % (ref 4.6–6.5)

## 2024-07-26 LAB — LIPID PANEL
Cholesterol: 141 mg/dL (ref 0–200)
HDL: 55.6 mg/dL (ref >39.00)
LDL Cholesterol: 59 mg/dL (ref 0–99)
NonHDL: 85.39
Total CHOL/HDL Ratio: 3
Triglycerides: 133 mg/dL (ref 0.0–149.0)
VLDL: 26.6 mg/dL (ref 0.0–40.0)

## 2024-07-26 LAB — COMPREHENSIVE METABOLIC PANEL WITH GFR
ALT: 10 U/L (ref 0–35)
AST: 16 U/L (ref 0–37)
Albumin: 3.9 g/dL (ref 3.5–5.2)
Alkaline Phosphatase: 49 U/L (ref 39–117)
BUN: 7 mg/dL (ref 6–23)
CO2: 30 meq/L (ref 19–32)
Calcium: 9.1 mg/dL (ref 8.4–10.5)
Chloride: 100 meq/L (ref 96–112)
Creatinine, Ser: 0.66 mg/dL (ref 0.40–1.20)
GFR: 107.62 mL/min (ref >60.00)
Glucose, Bld: 68 mg/dL — ABNORMAL LOW (ref 70–99)
Potassium: 4.6 meq/L (ref 3.5–5.1)
Sodium: 139 meq/L (ref 135–145)
Total Bilirubin: 0.5 mg/dL (ref 0.2–1.2)
Total Protein: 6.9 g/dL (ref 6.0–8.3)

## 2024-07-26 LAB — B12 AND FOLATE PANEL
Folate: 13.1 ng/mL (ref >5.9)
Vitamin B-12: 821 pg/mL (ref 211–911)

## 2024-07-26 NOTE — Patient Instructions (Signed)

## 2024-07-26 NOTE — Progress Notes (Signed)
 Patient ID: Jennifer Mcdowell, female  DOB: 1980/12/13, 43 y.o.   MRN: [id number] Patient Care Team    Relationship Specialty Notifications Start End  Catherine Charlies LABOR, DO PCP - General Family Medicine  03/16/18   Darcel Pool, MD Consulting Physician Obstetrics and Gynecology  03/16/18     Chief Complaint  Patient presents with   Annual Exam    Pt is fasting.     Subjective:  Jennifer Mcdowell is a 43 y.o.  Female  present for CPE. All past medical history, surgical history, allergies, family history, immunizations, medications and social history were updated in the electronic medical record today. All recent labs, ED visits and hospitalizations within the last year were reviewed.  Health maintenance:  Colonoscopy: No Fhx, screen at 45 Mammogram: Completed 09/26/2023. MGM and MGGM both w/ breast cancer [facility] Cervical cancer screening: last pap: 07/02/2024.  Gynecology Immunizations: tdap UTD 2019, Influenza  (encouraged yearly).  Infectious disease screening: HIV completed 2019.  Hepatitis C screening declined DEXA: screen at 60-65 Assistive device: none Oxygen [id number] Patient has a Dental home. Hospitalizations/ED visits: Reviewed       07/26/2024   10:16 AM 07/02/2024    1:37 PM 03/28/2023   11:32 AM 02/23/2023    7:45 AM 02/11/2022   11:41 AM  Depression screen PHQ 2/9  Decreased Interest 0 0 0 0 0  Down, Depressed, Hopeless 0 0 0 0 0  PHQ - 2 Score 0 0 0 0 0  Altered sleeping 0      Tired, decreased energy 0      Change in appetite 0      Feeling bad or failure about yourself  0      Trouble concentrating 0      Moving slowly or fidgety/restless 0      Suicidal thoughts 0      PHQ-9 Score 0      Difficult doing work/chores Not difficult at all          07/26/2024   10:16 AM  GAD 7 : Generalized Anxiety Score  Nervous, Anxious, on Edge 0  Control/stop worrying 0  Worry too much - different things 0  Trouble relaxing 0  Restless 0   Easily annoyed or irritable 0  Afraid - awful might happen 0  Total GAD 7 Score 0  Anxiety Difficulty Not difficult at all      Immunization History  Administered Date(s) Administered   Influenza,inj,Quad PF,6+ Mos 09/10/2018, 09/30/2019, 09/22/2021, 10/25/2022   PFIZER(Purple Top)SARS-COV-2 Vaccination 11/14/2019, 12/15/2019, 09/09/2020   Pfizer Covid-19 Vaccine Bivalent Booster 1yrs & up 12/03/2021   Tdap 03/16/2018     Past Medical History:  Diagnosis Date   Gestational diabetes    Lymphedema    left leg   No Known Allergies Past Surgical History:  Procedure Laterality Date   NO PAST SURGERIES     Family History  Problem Relation Age of Onset   Hypertension Mother    Hypertension Father    Breast cancer Maternal Grandmother 73       passed away at 49 in stage 4 at diagnosis.    Hypertension Maternal Grandfather    Cancer Paternal Grandmother    Social History   Social History Narrative   Not on file    Allergies as of 07/26/2024   No Known Allergies      Medication List        Accurate as of July 26, 2024 10:32 AM. If  you have any questions, ask your nurse or doctor.          Arazlo  0.045 % Lotn Generic drug: Tazarotene  Apply to face twice a week   EnilloRing  0.12-0.015 MG/24HR vaginal ring Generic drug: etonogestrel -ethinyl estradiol  Insert vaginally and leave in place for 3 consecutive weeks, then remove for 1 week.   Ferrex 150 150 MG capsule Generic drug: iron  polysaccharides Take 1 capsule  by mouth every other day.   Safety Seal Miscellaneous Misc Apply 1 Application topically at bedtime. Medication Name: Melaxemic Cream (Tranexamic Acid 5%, Kojic Aci 2%, Vitamin C 2.5%, Hyaluronic Acid 0.1%)   Safety Seal Miscellaneous Misc Apply 1 Application topically in the morning. Medication Name: Hormonic Hair Solution (Minoxidil 7%, Finasteride 0.05%)   spironolactone  100 MG tablet Commonly known as: ALDACTONE  Take 1 tablet (100 mg  total) by mouth daily.        All past medical history, surgical history, allergies, family history, immunizations andmedications were updated in the EMR today and reviewed under the history and medication portions of their EMR.     Recent Results (from the past 2160 hours)  Cytology - PAP( [place])     Status: None   Collection Time: 07/02/24  3:53 PM  Result Value Ref Range   High risk HPV Negative    Adequacy      Satisfactory for evaluation; transformation zone component PRESENT.   Diagnosis      - Negative for intraepithelial lesion or malignancy (NILM)   Comment Normal Reference Range HPV - Negative       ROS: 14 pt review of systems performed and negative (unless mentioned in an HPI)  Objective: BP 110/76   Pulse 73   Temp 98.3 F (36.8 C)   Ht 5' 4 (1.626 m)   Wt 120 lb 12.8 oz (54.8 kg)   LMP 07/25/2024   SpO2 98%   BMI 20.74 kg/m  Physical Exam Vitals and nursing note reviewed.  Constitutional:      General: She is not in acute distress.    Appearance: Normal appearance. She is not ill-appearing or toxic-appearing.  HENT:     Head: Normocephalic and atraumatic.     Right Ear: Tympanic membrane, ear canal and external ear normal. There is no impacted cerumen.     Left Ear: Tympanic membrane, ear canal and external ear normal. There is no impacted cerumen.     Nose: No congestion or rhinorrhea.     Mouth/Throat:     Mouth: Mucous membranes are moist.     Pharynx: Oropharynx is clear. No oropharyngeal exudate or posterior oropharyngeal erythema.  Eyes:     General: No scleral icterus.       Right eye: No discharge.        Left eye: No discharge.     Extraocular Movements: Extraocular movements intact.     Conjunctiva/sclera: Conjunctivae normal.     Pupils: Pupils are equal, round, and reactive to light.  Cardiovascular:     Rate and Rhythm: Normal rate and regular rhythm.     Pulses: Normal pulses.     Heart sounds: Normal heart sounds. No murmur  heard.    No friction rub. No gallop.  Pulmonary:     Effort: Pulmonary effort is normal. No respiratory distress.     Breath sounds: Normal breath sounds. No stridor. No wheezing, rhonchi or rales.  Chest:     Chest wall: No tenderness.  Abdominal:     General: Abdomen is  flat. Bowel sounds are normal. There is no distension.     Palpations: Abdomen is soft. There is no mass.     Tenderness: There is no abdominal tenderness. There is no right CVA tenderness, left CVA tenderness, guarding or rebound.     Hernia: No hernia is present.  Musculoskeletal:        General: No swelling, tenderness or deformity. Normal range of motion.     Cervical back: Normal range of motion and neck supple. No rigidity or tenderness.     Right lower leg: No edema.     Left lower leg: No edema.  Lymphadenopathy:     Cervical: No cervical adenopathy.  Skin:    General: Skin is warm and dry.     Coloration: Skin is not jaundiced or pale.     Findings: No bruising, erythema, lesion or rash.  Neurological:     General: No focal deficit present.     Mental Status: She is alert and oriented to person, place, and time. Mental status is at baseline.     Cranial Nerves: No cranial nerve deficit.     Sensory: No sensory deficit.     Motor: No weakness.     Coordination: Coordination normal.     Gait: Gait normal.     Deep Tendon Reflexes: Reflexes normal.  Psychiatric:        Mood and Affect: Mood normal.        Behavior: Behavior normal.        Thought Content: Thought content normal.        Judgment: Judgment normal.     No results found.  Assessment/plan: Ellena Kamen is a 43 y.o. female present for CPE Iron  deficiency anemia, unspecified iron  deficiency anemia type Off iron  since March, was taking iron  poly sacc 150 Cbc and iron  collected today Breast cancer screening by mammogram - MM 3D SCREENING MAMMOGRAM BILATERAL BREAST; Future Lipid screening - Lipid panel Diabetes mellitus  screening - Hemoglobin A1c Vitamin B12 deficiency - B12 and Folate Panel  Encounter for preventive health examination Patient was encouraged to exercise greater than 150 minutes a week. Patient was encouraged to choose a diet filled with fresh fruits and vegetables, and lean meats. AVS provided to patient today for education/recommendation on gender specific health and safety maintenance. Colonoscopy: No Fhx, screen at 45 Mammogram: Completed 09/26/2023. MGM and MGGM both w/ breast cancer [facility] Cervical cancer screening: last pap: 07/02/2024.  Gynecology Immunizations: tdap UTD 2019, Influenza  (encouraged yearly).  Infectious disease screening: HIV completed 2019.  Hepatitis C screening declined DEXA: screen at 60-65  CBC - Comprehensive metabolic panel with GFR - TSH  Return in about 1 year (around 07/27/2025) for cpe (20 min).  Orders Placed This Encounter  Procedures   MM 3D SCREENING MAMMOGRAM BILATERAL BREAST   CBC   Comprehensive metabolic panel with GFR   Hemoglobin A1c   Lipid panel   TSH   B12 and Folate Panel   IBC + Ferritin   No orders of the defined types were placed in this encounter.  Referral Orders  No referral(s) requested today     Electronically signed by: Charlies Bellini, DO [facility]

## 2024-07-29 ENCOUNTER — Ambulatory Visit: Payer: Self-pay | Admitting: Family Medicine

## 2024-09-13 ENCOUNTER — Other Ambulatory Visit (HOSPITAL_BASED_OUTPATIENT_CLINIC_OR_DEPARTMENT_OTHER): Payer: Self-pay

## 2024-09-26 ENCOUNTER — Ambulatory Visit
Admission: RE | Admit: 2024-09-26 | Discharge: 2024-09-26 | Disposition: A | Source: Ambulatory Visit | Attending: Family Medicine | Admitting: Family Medicine

## 2024-09-26 DIAGNOSIS — Z1231 Encounter for screening mammogram for malignant neoplasm of breast: Secondary | ICD-10-CM | POA: Diagnosis not present

## 2024-10-17 ENCOUNTER — Ambulatory Visit: Admitting: Dermatology

## 2024-10-17 ENCOUNTER — Encounter: Payer: Self-pay | Admitting: Dermatology

## 2024-10-17 DIAGNOSIS — L810 Postinflammatory hyperpigmentation: Secondary | ICD-10-CM

## 2024-10-17 DIAGNOSIS — L700 Acne vulgaris: Secondary | ICD-10-CM

## 2024-10-17 DIAGNOSIS — L649 Androgenic alopecia, unspecified: Secondary | ICD-10-CM | POA: Diagnosis not present

## 2024-10-17 MED ORDER — SAFETY SEAL MISCELLANEOUS MISC: 2 refills | Status: AC

## 2024-10-17 NOTE — Patient Instructions (Addendum)
 VISIT SUMMARY:  During your visit, we discussed your concerns about hair loss and skin issues. We reviewed your current treatments and made some adjustments to help improve your symptoms.  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 We increased your finasteride dosage to 0.1% daily to enhance its effectiveness in blocking DHT and preventing further hair loss. Continue taking Viviscal daily and try to use collagen powder consistently.  -ACNE VULGARIS:  Acne vulgaris is a common skin condition that causes pimples and inflammation. Your skin has improved significantly with your current regimen, but you are experiencing dryness. Reduce Arazlo  application to two nights a week during winter, use Avene moisturizer nightly, and apply a salicylic acid wash once a week. Continue using the glycolic acid toner weekly.  -POSTINFLAMMATORY HYPERPIGMENTATION OF FACE:  This condition involves dark spots on the skin following inflammation or injury. We switched your treatment to hydroquinone 8% for spot treatment of dark spots. Be cautious with its use to avoid overuse and potential halo effect.  INSTRUCTIONS:  Please follow up with us  in three months to assess the effectiveness of the new treatment plan and make any necessary adjustments.      Important Information  Due to recent changes in healthcare laws, you may see results of your pathology and/or laboratory studies on MyChart before the doctors have had a chance to review them. We understand that in some cases there may be results that are confusing or concerning to you. Please understand that not all results are received at the same time and often the doctors may need to interpret multiple results in order to provide you with the best plan of care or course of treatment. Therefore, we ask that you please give us  2 business days to thoroughly  review all your results before contacting the office for clarification. Should we see a critical lab result, you will be contacted sooner.   If You Need Anything After Your Visit  If you have any questions or concerns for your doctor, please call our main line at [phone] If no one answers, please leave a voicemail as directed and we will return your call as soon as possible. Messages left after 4 pm will be answered the following business day.   You may also send us  a message via MyChart. We typically respond to MyChart messages within 1-2 business days.  For prescription refills, please ask your pharmacy to contact our office. Our fax number is [phone].  If you have an urgent issue when the clinic is closed that cannot wait until the next business day, you can page your doctor at the number below.    Please note that while we do our best to be available for urgent issues outside of office hours, we are not available 24/7.   If you have an urgent issue and are unable to reach us , you may choose to seek medical care at your doctor's office, retail clinic, urgent care center, or emergency room.  If you have a medical emergency, please immediately call 911 or go to the emergency department. In the event of inclement weather, please call our main line at [phone] for an update on the status of any delays or closures.  Dermatology Medication Tips: Please keep the boxes that topical medications come in in order to help keep track of the instructions about where and how to use these. Pharmacies typically print the  medication instructions only on the boxes and not directly on the medication tubes.   If your medication is too expensive, please contact our office at [phone] or send us  a message through MyChart.   We are unable to tell what your co-pay for medications will be in advance as this is different depending on your insurance coverage. However, we may be able to find a  substitute medication at lower cost or fill out paperwork to get insurance to cover a needed medication.   If a prior authorization is required to get your medication covered by your insurance company, please allow us  1-2 business days to complete this process.  Drug prices often vary depending on where the prescription is filled and some pharmacies may offer cheaper prices.  The website www.goodrx.com contains coupons for medications through different pharmacies. The prices here do not account for what the cost may be with help from insurance (it may be cheaper with your insurance), but the website can give you the price if you did not use any insurance.  - You can print the associated coupon and take it with your prescription to the pharmacy.  - You may also stop by our office during regular business hours and pick up a GoodRx coupon card.  - If you need your prescription sent electronically to a different pharmacy, notify our office through [facility] or by phone at [phone]

## 2024-10-17 NOTE — Progress Notes (Signed)
   Follow-Up Visit   Subjective  Jennifer Mcdowell is a 43 y.o. female established patient who presents for FOLLOW UP on the diagnoses listed below:  Patient (and/or pt guardian) consented to the use of AI-assisted tools for note generation.   Patient was last evaluated on 04/16/24.   Androgenetic Alopecia: Prescribed MedRock finasteride 0.05% minoxidil 7% solution that she is applying daily. She is taking Viviscal daily & Vital Protein when she can remember. Patient reports sxs are better but she feels within the last 2 months she has been losing a lot of hair.    Acne: She is using Arazlo  3 nights a week and Spirolactone daily. She stated her skin is a lot better.    Are you nursing, pregnant or trying to conceive? No   The following portions of the chart were reviewed this encounter and updated as appropriate: medications, allergies, medical history  Review of Systems:  No other skin or systemic complaints except as noted in HPI or Assessment and Plan.  Objective  Well appearing patient in no apparent distress; mood and affect are within normal limits.   A focused examination was performed of the following areas: scalp   Relevant exam findings are noted in the Assessment and Plan.                    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 Possible contributing factors include stress, age-related changes, or hormonal fluctuations. No recent surgeries or new medications. Stopped iron  supplementation, which may contribute to hair shedding. No signs of telogen effluvium as hair shedding is not aggressive. - Increased finasteride to 0.1% daily to enhance DHT blocking and prevent further shedding and thinning. - Continue Viviscal daily. - Encouraged consistent use of collagen powder if possible.  Acne vulgaris Significant improvement in  skin condition with current treatment regimen. Experiencing dryness, likely due to winter weather and current topical treatments. - Reduce Arazlo  application to two nights a week during winter months. - Use Avene moisturizer nightly to repair skin barrier. - Apply salicylic acid wash once a week to exfoliate and boost skin glow. - Continue glycolic acid toner weekly.  Postinflammatory hyperpigmentation of face Using spot treatment for dark spots, but current treatment is not effective and causes dryness. Interested in a stronger treatment option. - Switched to hydroquinone 8% for spot treatment of dark spots. - Advised caution to avoid overuse and potential halo effect.   No follow-ups on file.   Documentation: I have reviewed the above documentation for accuracy and completeness, and I agree with the above.  I, Shirron Maranda, CMA, am acting as scribe for [employer], DO.   Delon Lenis, DO

## 2024-11-21 ENCOUNTER — Other Ambulatory Visit (HOSPITAL_BASED_OUTPATIENT_CLINIC_OR_DEPARTMENT_OTHER): Payer: Self-pay

## 2024-11-21 MED ORDER — FLUZONE 0.5 ML IM SUSY
0.5000 mL | PREFILLED_SYRINGE | Freq: Once | INTRAMUSCULAR | 0 refills | Status: AC
  Filled 2024-11-21: qty 0.5, 1d supply, fill #0

## 2024-12-23 ENCOUNTER — Other Ambulatory Visit: Payer: Self-pay

## 2024-12-23 ENCOUNTER — Other Ambulatory Visit (HOSPITAL_COMMUNITY): Payer: Self-pay

## 2024-12-23 ENCOUNTER — Other Ambulatory Visit: Payer: Self-pay | Admitting: Dermatology

## 2024-12-23 MED ORDER — SPIRONOLACTONE 100 MG PO TABS
100.0000 mg | ORAL_TABLET | Freq: Every day | ORAL | 3 refills | Status: AC
  Filled 2024-12-23 – 2025-01-07 (×2): qty 90, 90d supply, fill #0

## 2025-01-06 ENCOUNTER — Other Ambulatory Visit (HOSPITAL_COMMUNITY): Payer: Self-pay

## 2025-01-07 ENCOUNTER — Other Ambulatory Visit (HOSPITAL_COMMUNITY): Payer: Self-pay

## 2025-04-24 ENCOUNTER — Ambulatory Visit: Admitting: Dermatology

## 2025-07-07 ENCOUNTER — Ambulatory Visit (HOSPITAL_BASED_OUTPATIENT_CLINIC_OR_DEPARTMENT_OTHER): Admitting: Certified Nurse Midwife
